# Patient Record
Sex: Female | Born: 1945
Health system: Southern US, Community
[De-identification: ages and names within clinical notes are randomized; demographics above are authoritative.]

## PROBLEM LIST (undated history)

## (undated) DIAGNOSIS — F329 Major depressive disorder, single episode, unspecified: Secondary | ICD-10-CM

## (undated) DIAGNOSIS — F419 Anxiety disorder, unspecified: Secondary | ICD-10-CM

## (undated) DIAGNOSIS — M419 Scoliosis, unspecified: Secondary | ICD-10-CM

## (undated) DIAGNOSIS — F32A Depression, unspecified: Secondary | ICD-10-CM

## (undated) DIAGNOSIS — M1990 Unspecified osteoarthritis, unspecified site: Secondary | ICD-10-CM

## (undated) DIAGNOSIS — H269 Unspecified cataract: Secondary | ICD-10-CM

## (undated) DIAGNOSIS — T7840XA Allergy, unspecified, initial encounter: Secondary | ICD-10-CM

## (undated) HISTORY — DX: Major depressive disorder, single episode, unspecified: F32.9

## (undated) HISTORY — PX: CHOLECYSTECTOMY: SHX55

## (undated) HISTORY — DX: Unspecified cataract: H26.9

## (undated) HISTORY — DX: Unspecified osteoarthritis, unspecified site: M19.90

## (undated) HISTORY — DX: Depression, unspecified: F32.A

## (undated) HISTORY — DX: Scoliosis, unspecified: M41.9

## (undated) HISTORY — DX: Allergy, unspecified, initial encounter: T78.40XA

## (undated) HISTORY — DX: Anxiety disorder, unspecified: F41.9

---

## 1999-08-24 ENCOUNTER — Emergency Department (HOSPITAL_COMMUNITY): Admission: EM | Admit: 1999-08-24 | Discharge: 1999-08-25 | Payer: Self-pay

## 2014-01-24 DIAGNOSIS — M545 Low back pain: Secondary | ICD-10-CM | POA: Diagnosis not present

## 2014-01-24 DIAGNOSIS — M4126 Other idiopathic scoliosis, lumbar region: Secondary | ICD-10-CM | POA: Diagnosis not present

## 2014-01-24 DIAGNOSIS — M5126 Other intervertebral disc displacement, lumbar region: Secondary | ICD-10-CM | POA: Diagnosis not present

## 2014-02-10 DIAGNOSIS — B0229 Other postherpetic nervous system involvement: Secondary | ICD-10-CM | POA: Diagnosis not present

## 2014-02-10 DIAGNOSIS — L0390 Cellulitis, unspecified: Secondary | ICD-10-CM | POA: Diagnosis not present

## 2014-02-10 DIAGNOSIS — L309 Dermatitis, unspecified: Secondary | ICD-10-CM | POA: Diagnosis not present

## 2014-02-10 DIAGNOSIS — Z23 Encounter for immunization: Secondary | ICD-10-CM | POA: Diagnosis not present

## 2014-02-21 DIAGNOSIS — M5417 Radiculopathy, lumbosacral region: Secondary | ICD-10-CM | POA: Diagnosis not present

## 2014-02-21 DIAGNOSIS — M4126 Other idiopathic scoliosis, lumbar region: Secondary | ICD-10-CM | POA: Diagnosis not present

## 2014-02-21 DIAGNOSIS — M5416 Radiculopathy, lumbar region: Secondary | ICD-10-CM | POA: Diagnosis not present

## 2014-02-21 DIAGNOSIS — M5126 Other intervertebral disc displacement, lumbar region: Secondary | ICD-10-CM | POA: Diagnosis not present

## 2014-02-21 DIAGNOSIS — B029 Zoster without complications: Secondary | ICD-10-CM | POA: Diagnosis not present

## 2014-02-21 DIAGNOSIS — M791 Myalgia: Secondary | ICD-10-CM | POA: Diagnosis not present

## 2014-02-21 DIAGNOSIS — M545 Low back pain: Secondary | ICD-10-CM | POA: Diagnosis not present

## 2014-02-21 DIAGNOSIS — M47817 Spondylosis without myelopathy or radiculopathy, lumbosacral region: Secondary | ICD-10-CM | POA: Diagnosis not present

## 2014-02-27 DIAGNOSIS — M79632 Pain in left forearm: Secondary | ICD-10-CM | POA: Diagnosis not present

## 2014-02-27 DIAGNOSIS — S0083XA Contusion of other part of head, initial encounter: Secondary | ICD-10-CM | POA: Diagnosis not present

## 2014-02-27 DIAGNOSIS — M5030 Other cervical disc degeneration, unspecified cervical region: Secondary | ICD-10-CM | POA: Diagnosis not present

## 2014-02-27 DIAGNOSIS — S60222A Contusion of left hand, initial encounter: Secondary | ICD-10-CM | POA: Diagnosis not present

## 2014-02-27 DIAGNOSIS — S233XXA Sprain of ligaments of thoracic spine, initial encounter: Secondary | ICD-10-CM | POA: Diagnosis not present

## 2014-02-27 DIAGNOSIS — F329 Major depressive disorder, single episode, unspecified: Secondary | ICD-10-CM | POA: Diagnosis not present

## 2014-02-27 DIAGNOSIS — G2581 Restless legs syndrome: Secondary | ICD-10-CM | POA: Diagnosis not present

## 2014-02-27 DIAGNOSIS — F17210 Nicotine dependence, cigarettes, uncomplicated: Secondary | ICD-10-CM | POA: Diagnosis not present

## 2014-02-27 DIAGNOSIS — M79641 Pain in right hand: Secondary | ICD-10-CM | POA: Diagnosis not present

## 2014-02-27 DIAGNOSIS — Z882 Allergy status to sulfonamides status: Secondary | ICD-10-CM | POA: Diagnosis not present

## 2014-02-27 DIAGNOSIS — S40022A Contusion of left upper arm, initial encounter: Secondary | ICD-10-CM | POA: Diagnosis not present

## 2014-02-27 DIAGNOSIS — M4834 Traumatic spondylopathy, thoracic region: Secondary | ICD-10-CM | POA: Diagnosis not present

## 2014-02-27 DIAGNOSIS — M25512 Pain in left shoulder: Secondary | ICD-10-CM | POA: Diagnosis not present

## 2014-02-27 DIAGNOSIS — M4120 Other idiopathic scoliosis, site unspecified: Secondary | ICD-10-CM | POA: Diagnosis not present

## 2014-02-27 DIAGNOSIS — S0090XA Unspecified superficial injury of unspecified part of head, initial encounter: Secondary | ICD-10-CM | POA: Diagnosis not present

## 2014-02-27 DIAGNOSIS — Z79899 Other long term (current) drug therapy: Secondary | ICD-10-CM | POA: Diagnosis not present

## 2014-02-27 DIAGNOSIS — Z880 Allergy status to penicillin: Secondary | ICD-10-CM | POA: Diagnosis not present

## 2014-02-27 DIAGNOSIS — I679 Cerebrovascular disease, unspecified: Secondary | ICD-10-CM | POA: Diagnosis not present

## 2014-02-27 DIAGNOSIS — S1090XA Unspecified superficial injury of unspecified part of neck, initial encounter: Secondary | ICD-10-CM | POA: Diagnosis not present

## 2014-03-21 DIAGNOSIS — M5416 Radiculopathy, lumbar region: Secondary | ICD-10-CM | POA: Diagnosis not present

## 2014-03-21 DIAGNOSIS — M5126 Other intervertebral disc displacement, lumbar region: Secondary | ICD-10-CM | POA: Diagnosis not present

## 2014-03-21 DIAGNOSIS — M5417 Radiculopathy, lumbosacral region: Secondary | ICD-10-CM | POA: Diagnosis not present

## 2014-03-21 DIAGNOSIS — Z79899 Other long term (current) drug therapy: Secondary | ICD-10-CM | POA: Diagnosis not present

## 2014-03-21 DIAGNOSIS — M791 Myalgia: Secondary | ICD-10-CM | POA: Diagnosis not present

## 2014-03-21 DIAGNOSIS — B029 Zoster without complications: Secondary | ICD-10-CM | POA: Diagnosis not present

## 2014-03-21 DIAGNOSIS — M545 Low back pain: Secondary | ICD-10-CM | POA: Diagnosis not present

## 2014-03-21 DIAGNOSIS — M47817 Spondylosis without myelopathy or radiculopathy, lumbosacral region: Secondary | ICD-10-CM | POA: Diagnosis not present

## 2014-03-21 DIAGNOSIS — M4126 Other idiopathic scoliosis, lumbar region: Secondary | ICD-10-CM | POA: Diagnosis not present

## 2014-04-17 DIAGNOSIS — H04129 Dry eye syndrome of unspecified lacrimal gland: Secondary | ICD-10-CM | POA: Diagnosis not present

## 2014-04-17 DIAGNOSIS — H16143 Punctate keratitis, bilateral: Secondary | ICD-10-CM | POA: Diagnosis not present

## 2014-04-18 DIAGNOSIS — B029 Zoster without complications: Secondary | ICD-10-CM | POA: Diagnosis not present

## 2014-04-18 DIAGNOSIS — M47817 Spondylosis without myelopathy or radiculopathy, lumbosacral region: Secondary | ICD-10-CM | POA: Diagnosis not present

## 2014-04-18 DIAGNOSIS — M5416 Radiculopathy, lumbar region: Secondary | ICD-10-CM | POA: Diagnosis not present

## 2014-04-18 DIAGNOSIS — M5126 Other intervertebral disc displacement, lumbar region: Secondary | ICD-10-CM | POA: Diagnosis not present

## 2014-04-18 DIAGNOSIS — M4126 Other idiopathic scoliosis, lumbar region: Secondary | ICD-10-CM | POA: Diagnosis not present

## 2014-04-18 DIAGNOSIS — M791 Myalgia: Secondary | ICD-10-CM | POA: Diagnosis not present

## 2014-04-18 DIAGNOSIS — M545 Low back pain: Secondary | ICD-10-CM | POA: Diagnosis not present

## 2014-04-18 DIAGNOSIS — M5417 Radiculopathy, lumbosacral region: Secondary | ICD-10-CM | POA: Diagnosis not present

## 2014-05-02 DIAGNOSIS — H04129 Dry eye syndrome of unspecified lacrimal gland: Secondary | ICD-10-CM | POA: Diagnosis not present

## 2014-05-02 DIAGNOSIS — H16143 Punctate keratitis, bilateral: Secondary | ICD-10-CM | POA: Diagnosis not present

## 2014-05-02 DIAGNOSIS — F17200 Nicotine dependence, unspecified, uncomplicated: Secondary | ICD-10-CM | POA: Diagnosis not present

## 2014-05-16 DIAGNOSIS — M5417 Radiculopathy, lumbosacral region: Secondary | ICD-10-CM | POA: Diagnosis not present

## 2014-05-16 DIAGNOSIS — B029 Zoster without complications: Secondary | ICD-10-CM | POA: Diagnosis not present

## 2014-05-16 DIAGNOSIS — M5126 Other intervertebral disc displacement, lumbar region: Secondary | ICD-10-CM | POA: Diagnosis not present

## 2014-05-16 DIAGNOSIS — M5416 Radiculopathy, lumbar region: Secondary | ICD-10-CM | POA: Diagnosis not present

## 2014-05-16 DIAGNOSIS — M47817 Spondylosis without myelopathy or radiculopathy, lumbosacral region: Secondary | ICD-10-CM | POA: Diagnosis not present

## 2014-05-16 DIAGNOSIS — M4126 Other idiopathic scoliosis, lumbar region: Secondary | ICD-10-CM | POA: Diagnosis not present

## 2014-05-16 DIAGNOSIS — M791 Myalgia: Secondary | ICD-10-CM | POA: Diagnosis not present

## 2014-05-16 DIAGNOSIS — M545 Low back pain: Secondary | ICD-10-CM | POA: Diagnosis not present

## 2014-06-03 DIAGNOSIS — M810 Age-related osteoporosis without current pathological fracture: Secondary | ICD-10-CM | POA: Diagnosis not present

## 2014-06-03 DIAGNOSIS — G2581 Restless legs syndrome: Secondary | ICD-10-CM | POA: Diagnosis not present

## 2014-06-03 DIAGNOSIS — R609 Edema, unspecified: Secondary | ICD-10-CM | POA: Diagnosis not present

## 2014-06-03 DIAGNOSIS — Z780 Asymptomatic menopausal state: Secondary | ICD-10-CM | POA: Diagnosis not present

## 2014-06-03 DIAGNOSIS — Z87891 Personal history of nicotine dependence: Secondary | ICD-10-CM | POA: Diagnosis not present

## 2014-06-03 DIAGNOSIS — F329 Major depressive disorder, single episode, unspecified: Secondary | ICD-10-CM | POA: Diagnosis not present

## 2014-06-03 DIAGNOSIS — Z880 Allergy status to penicillin: Secondary | ICD-10-CM | POA: Diagnosis not present

## 2014-06-03 DIAGNOSIS — Z882 Allergy status to sulfonamides status: Secondary | ICD-10-CM | POA: Diagnosis not present

## 2014-06-03 DIAGNOSIS — R600 Localized edema: Secondary | ICD-10-CM | POA: Diagnosis not present

## 2014-06-03 DIAGNOSIS — G8929 Other chronic pain: Secondary | ICD-10-CM | POA: Diagnosis not present

## 2014-06-13 DIAGNOSIS — M4126 Other idiopathic scoliosis, lumbar region: Secondary | ICD-10-CM | POA: Diagnosis not present

## 2014-06-13 DIAGNOSIS — M5126 Other intervertebral disc displacement, lumbar region: Secondary | ICD-10-CM | POA: Diagnosis not present

## 2014-06-13 DIAGNOSIS — M791 Myalgia: Secondary | ICD-10-CM | POA: Diagnosis not present

## 2014-06-13 DIAGNOSIS — M545 Low back pain: Secondary | ICD-10-CM | POA: Diagnosis not present

## 2014-06-13 DIAGNOSIS — M5417 Radiculopathy, lumbosacral region: Secondary | ICD-10-CM | POA: Diagnosis not present

## 2014-06-13 DIAGNOSIS — M47817 Spondylosis without myelopathy or radiculopathy, lumbosacral region: Secondary | ICD-10-CM | POA: Diagnosis not present

## 2014-06-13 DIAGNOSIS — M5416 Radiculopathy, lumbar region: Secondary | ICD-10-CM | POA: Diagnosis not present

## 2014-06-13 DIAGNOSIS — B029 Zoster without complications: Secondary | ICD-10-CM | POA: Diagnosis not present

## 2014-06-26 DIAGNOSIS — M25531 Pain in right wrist: Secondary | ICD-10-CM | POA: Diagnosis not present

## 2014-06-26 DIAGNOSIS — M419 Scoliosis, unspecified: Secondary | ICD-10-CM | POA: Diagnosis not present

## 2014-06-26 DIAGNOSIS — R600 Localized edema: Secondary | ICD-10-CM | POA: Diagnosis not present

## 2014-06-26 DIAGNOSIS — M25539 Pain in unspecified wrist: Secondary | ICD-10-CM | POA: Diagnosis not present

## 2014-06-26 DIAGNOSIS — M5417 Radiculopathy, lumbosacral region: Secondary | ICD-10-CM | POA: Diagnosis not present

## 2014-06-26 DIAGNOSIS — F329 Major depressive disorder, single episode, unspecified: Secondary | ICD-10-CM | POA: Diagnosis not present

## 2014-06-26 DIAGNOSIS — F411 Generalized anxiety disorder: Secondary | ICD-10-CM | POA: Diagnosis not present

## 2014-07-12 DIAGNOSIS — M47817 Spondylosis without myelopathy or radiculopathy, lumbosacral region: Secondary | ICD-10-CM | POA: Diagnosis not present

## 2014-07-12 DIAGNOSIS — M4126 Other idiopathic scoliosis, lumbar region: Secondary | ICD-10-CM | POA: Diagnosis not present

## 2014-07-12 DIAGNOSIS — M791 Myalgia: Secondary | ICD-10-CM | POA: Diagnosis not present

## 2014-07-12 DIAGNOSIS — M5417 Radiculopathy, lumbosacral region: Secondary | ICD-10-CM | POA: Diagnosis not present

## 2014-07-12 DIAGNOSIS — M545 Low back pain: Secondary | ICD-10-CM | POA: Diagnosis not present

## 2014-07-12 DIAGNOSIS — M5126 Other intervertebral disc displacement, lumbar region: Secondary | ICD-10-CM | POA: Diagnosis not present

## 2014-07-12 DIAGNOSIS — B029 Zoster without complications: Secondary | ICD-10-CM | POA: Diagnosis not present

## 2014-07-12 DIAGNOSIS — M5416 Radiculopathy, lumbar region: Secondary | ICD-10-CM | POA: Diagnosis not present

## 2014-08-09 DIAGNOSIS — G8929 Other chronic pain: Secondary | ICD-10-CM | POA: Diagnosis not present

## 2014-08-09 DIAGNOSIS — M549 Dorsalgia, unspecified: Secondary | ICD-10-CM | POA: Diagnosis not present

## 2014-08-10 DIAGNOSIS — M549 Dorsalgia, unspecified: Secondary | ICD-10-CM | POA: Diagnosis not present

## 2014-08-10 DIAGNOSIS — R1111 Vomiting without nausea: Secondary | ICD-10-CM | POA: Diagnosis not present

## 2014-08-10 DIAGNOSIS — R609 Edema, unspecified: Secondary | ICD-10-CM | POA: Diagnosis not present

## 2014-08-10 DIAGNOSIS — M419 Scoliosis, unspecified: Secondary | ICD-10-CM | POA: Diagnosis not present

## 2014-08-10 DIAGNOSIS — F329 Major depressive disorder, single episode, unspecified: Secondary | ICD-10-CM | POA: Diagnosis not present

## 2014-08-10 DIAGNOSIS — M545 Low back pain: Secondary | ICD-10-CM | POA: Diagnosis not present

## 2014-08-10 DIAGNOSIS — G8929 Other chronic pain: Secondary | ICD-10-CM | POA: Diagnosis not present

## 2014-08-22 DIAGNOSIS — M545 Low back pain: Secondary | ICD-10-CM | POA: Diagnosis not present

## 2014-08-22 DIAGNOSIS — M419 Scoliosis, unspecified: Secondary | ICD-10-CM | POA: Diagnosis not present

## 2014-08-22 DIAGNOSIS — R1111 Vomiting without nausea: Secondary | ICD-10-CM | POA: Diagnosis not present

## 2014-08-22 DIAGNOSIS — M549 Dorsalgia, unspecified: Secondary | ICD-10-CM | POA: Diagnosis not present

## 2014-08-22 DIAGNOSIS — G8929 Other chronic pain: Secondary | ICD-10-CM | POA: Diagnosis not present

## 2014-08-26 DIAGNOSIS — M545 Low back pain: Secondary | ICD-10-CM | POA: Diagnosis not present

## 2014-08-26 DIAGNOSIS — M25559 Pain in unspecified hip: Secondary | ICD-10-CM | POA: Diagnosis not present

## 2014-08-26 DIAGNOSIS — Z87891 Personal history of nicotine dependence: Secondary | ICD-10-CM | POA: Diagnosis not present

## 2014-08-26 DIAGNOSIS — G8929 Other chronic pain: Secondary | ICD-10-CM | POA: Diagnosis not present

## 2014-09-05 DIAGNOSIS — Z79891 Long term (current) use of opiate analgesic: Secondary | ICD-10-CM | POA: Diagnosis not present

## 2014-09-05 DIAGNOSIS — M791 Myalgia: Secondary | ICD-10-CM | POA: Diagnosis not present

## 2014-09-05 DIAGNOSIS — G8929 Other chronic pain: Secondary | ICD-10-CM | POA: Diagnosis not present

## 2014-09-05 DIAGNOSIS — M4726 Other spondylosis with radiculopathy, lumbar region: Secondary | ICD-10-CM | POA: Diagnosis not present

## 2014-09-14 DIAGNOSIS — M47816 Spondylosis without myelopathy or radiculopathy, lumbar region: Secondary | ICD-10-CM | POA: Diagnosis not present

## 2014-09-14 DIAGNOSIS — M5136 Other intervertebral disc degeneration, lumbar region: Secondary | ICD-10-CM | POA: Diagnosis not present

## 2014-09-14 DIAGNOSIS — G8929 Other chronic pain: Secondary | ICD-10-CM | POA: Diagnosis not present

## 2014-09-14 DIAGNOSIS — R296 Repeated falls: Secondary | ICD-10-CM | POA: Diagnosis not present

## 2014-09-14 DIAGNOSIS — R110 Nausea: Secondary | ICD-10-CM | POA: Diagnosis not present

## 2014-09-14 DIAGNOSIS — M545 Low back pain: Secondary | ICD-10-CM | POA: Diagnosis not present

## 2014-09-14 DIAGNOSIS — Z87891 Personal history of nicotine dependence: Secondary | ICD-10-CM | POA: Diagnosis not present

## 2014-09-14 DIAGNOSIS — M4316 Spondylolisthesis, lumbar region: Secondary | ICD-10-CM | POA: Diagnosis not present

## 2014-09-22 DIAGNOSIS — Z79891 Long term (current) use of opiate analgesic: Secondary | ICD-10-CM | POA: Diagnosis not present

## 2014-09-22 DIAGNOSIS — M4724 Other spondylosis with radiculopathy, thoracic region: Secondary | ICD-10-CM | POA: Diagnosis not present

## 2014-09-22 DIAGNOSIS — G8929 Other chronic pain: Secondary | ICD-10-CM | POA: Diagnosis not present

## 2014-09-22 DIAGNOSIS — M791 Myalgia: Secondary | ICD-10-CM | POA: Diagnosis not present

## 2014-10-03 DIAGNOSIS — M5136 Other intervertebral disc degeneration, lumbar region: Secondary | ICD-10-CM | POA: Diagnosis not present

## 2014-10-03 DIAGNOSIS — M4726 Other spondylosis with radiculopathy, lumbar region: Secondary | ICD-10-CM | POA: Diagnosis not present

## 2014-10-03 DIAGNOSIS — Z79891 Long term (current) use of opiate analgesic: Secondary | ICD-10-CM | POA: Diagnosis not present

## 2014-10-03 DIAGNOSIS — G8929 Other chronic pain: Secondary | ICD-10-CM | POA: Diagnosis not present

## 2014-10-18 DIAGNOSIS — G8929 Other chronic pain: Secondary | ICD-10-CM | POA: Diagnosis not present

## 2014-10-18 DIAGNOSIS — M4726 Other spondylosis with radiculopathy, lumbar region: Secondary | ICD-10-CM | POA: Diagnosis not present

## 2014-10-18 DIAGNOSIS — M47814 Spondylosis without myelopathy or radiculopathy, thoracic region: Secondary | ICD-10-CM | POA: Diagnosis not present

## 2014-10-18 DIAGNOSIS — Z79891 Long term (current) use of opiate analgesic: Secondary | ICD-10-CM | POA: Diagnosis not present

## 2014-11-29 DIAGNOSIS — M545 Low back pain: Secondary | ICD-10-CM | POA: Diagnosis not present

## 2014-11-29 DIAGNOSIS — M4806 Spinal stenosis, lumbar region: Secondary | ICD-10-CM | POA: Diagnosis not present

## 2014-11-29 DIAGNOSIS — G8929 Other chronic pain: Secondary | ICD-10-CM | POA: Diagnosis not present

## 2014-11-29 DIAGNOSIS — M41115 Juvenile idiopathic scoliosis, thoracolumbar region: Secondary | ICD-10-CM | POA: Diagnosis not present

## 2014-12-20 DIAGNOSIS — R110 Nausea: Secondary | ICD-10-CM | POA: Diagnosis not present

## 2014-12-20 DIAGNOSIS — G8929 Other chronic pain: Secondary | ICD-10-CM | POA: Diagnosis not present

## 2014-12-20 DIAGNOSIS — J0100 Acute maxillary sinusitis, unspecified: Secondary | ICD-10-CM | POA: Diagnosis not present

## 2014-12-20 DIAGNOSIS — R51 Headache: Secondary | ICD-10-CM | POA: Diagnosis not present

## 2014-12-20 DIAGNOSIS — M545 Low back pain: Secondary | ICD-10-CM | POA: Diagnosis not present

## 2015-01-19 DIAGNOSIS — M47817 Spondylosis without myelopathy or radiculopathy, lumbosacral region: Secondary | ICD-10-CM | POA: Diagnosis not present

## 2015-01-19 DIAGNOSIS — M4806 Spinal stenosis, lumbar region: Secondary | ICD-10-CM | POA: Diagnosis not present

## 2015-01-19 DIAGNOSIS — Z79891 Long term (current) use of opiate analgesic: Secondary | ICD-10-CM | POA: Diagnosis not present

## 2015-01-19 DIAGNOSIS — M4125 Other idiopathic scoliosis, thoracolumbar region: Secondary | ICD-10-CM | POA: Diagnosis not present

## 2015-01-19 DIAGNOSIS — M546 Pain in thoracic spine: Secondary | ICD-10-CM | POA: Diagnosis not present

## 2015-02-02 DIAGNOSIS — M47817 Spondylosis without myelopathy or radiculopathy, lumbosacral region: Secondary | ICD-10-CM | POA: Diagnosis not present

## 2015-02-02 DIAGNOSIS — Z79891 Long term (current) use of opiate analgesic: Secondary | ICD-10-CM | POA: Diagnosis not present

## 2015-02-02 DIAGNOSIS — M546 Pain in thoracic spine: Secondary | ICD-10-CM | POA: Diagnosis not present

## 2015-02-02 DIAGNOSIS — M4125 Other idiopathic scoliosis, thoracolumbar region: Secondary | ICD-10-CM | POA: Diagnosis not present

## 2015-02-02 DIAGNOSIS — M4806 Spinal stenosis, lumbar region: Secondary | ICD-10-CM | POA: Diagnosis not present

## 2015-03-07 DIAGNOSIS — M546 Pain in thoracic spine: Secondary | ICD-10-CM | POA: Diagnosis not present

## 2015-03-07 DIAGNOSIS — Z79891 Long term (current) use of opiate analgesic: Secondary | ICD-10-CM | POA: Diagnosis not present

## 2015-03-07 DIAGNOSIS — M47817 Spondylosis without myelopathy or radiculopathy, lumbosacral region: Secondary | ICD-10-CM | POA: Diagnosis not present

## 2015-03-07 DIAGNOSIS — M4806 Spinal stenosis, lumbar region: Secondary | ICD-10-CM | POA: Diagnosis not present

## 2015-03-07 DIAGNOSIS — M4125 Other idiopathic scoliosis, thoracolumbar region: Secondary | ICD-10-CM | POA: Diagnosis not present

## 2015-03-15 DIAGNOSIS — L309 Dermatitis, unspecified: Secondary | ICD-10-CM | POA: Diagnosis not present

## 2015-03-15 DIAGNOSIS — L03211 Cellulitis of face: Secondary | ICD-10-CM | POA: Diagnosis not present

## 2015-03-29 DIAGNOSIS — F329 Major depressive disorder, single episode, unspecified: Secondary | ICD-10-CM | POA: Diagnosis not present

## 2015-04-03 DIAGNOSIS — Z79891 Long term (current) use of opiate analgesic: Secondary | ICD-10-CM | POA: Diagnosis not present

## 2015-04-03 DIAGNOSIS — M546 Pain in thoracic spine: Secondary | ICD-10-CM | POA: Diagnosis not present

## 2015-04-03 DIAGNOSIS — M47817 Spondylosis without myelopathy or radiculopathy, lumbosacral region: Secondary | ICD-10-CM | POA: Diagnosis not present

## 2015-04-03 DIAGNOSIS — M4806 Spinal stenosis, lumbar region: Secondary | ICD-10-CM | POA: Diagnosis not present

## 2015-04-03 DIAGNOSIS — M4125 Other idiopathic scoliosis, thoracolumbar region: Secondary | ICD-10-CM | POA: Diagnosis not present

## 2015-05-01 DIAGNOSIS — M25552 Pain in left hip: Secondary | ICD-10-CM | POA: Diagnosis not present

## 2015-05-01 DIAGNOSIS — M4806 Spinal stenosis, lumbar region: Secondary | ICD-10-CM | POA: Diagnosis not present

## 2015-05-01 DIAGNOSIS — M47817 Spondylosis without myelopathy or radiculopathy, lumbosacral region: Secondary | ICD-10-CM | POA: Diagnosis not present

## 2015-05-01 DIAGNOSIS — Z79891 Long term (current) use of opiate analgesic: Secondary | ICD-10-CM | POA: Diagnosis not present

## 2015-05-01 DIAGNOSIS — M4125 Other idiopathic scoliosis, thoracolumbar region: Secondary | ICD-10-CM | POA: Diagnosis not present

## 2015-05-02 DIAGNOSIS — M5136 Other intervertebral disc degeneration, lumbar region: Secondary | ICD-10-CM | POA: Diagnosis not present

## 2015-05-02 DIAGNOSIS — M25552 Pain in left hip: Secondary | ICD-10-CM | POA: Diagnosis not present

## 2015-05-02 DIAGNOSIS — M4125 Other idiopathic scoliosis, thoracolumbar region: Secondary | ICD-10-CM | POA: Diagnosis not present

## 2015-05-02 DIAGNOSIS — M25551 Pain in right hip: Secondary | ICD-10-CM | POA: Diagnosis not present

## 2015-05-02 DIAGNOSIS — M419 Scoliosis, unspecified: Secondary | ICD-10-CM | POA: Diagnosis not present

## 2015-05-02 DIAGNOSIS — M47817 Spondylosis without myelopathy or radiculopathy, lumbosacral region: Secondary | ICD-10-CM | POA: Diagnosis not present

## 2015-05-02 DIAGNOSIS — M4806 Spinal stenosis, lumbar region: Secondary | ICD-10-CM | POA: Diagnosis not present

## 2015-05-02 DIAGNOSIS — M4126 Other idiopathic scoliosis, lumbar region: Secondary | ICD-10-CM | POA: Diagnosis not present

## 2015-05-22 DIAGNOSIS — M545 Low back pain: Secondary | ICD-10-CM | POA: Diagnosis not present

## 2015-05-22 DIAGNOSIS — M4806 Spinal stenosis, lumbar region: Secondary | ICD-10-CM | POA: Diagnosis not present

## 2015-05-22 DIAGNOSIS — F411 Generalized anxiety disorder: Secondary | ICD-10-CM | POA: Diagnosis not present

## 2015-05-22 DIAGNOSIS — F329 Major depressive disorder, single episode, unspecified: Secondary | ICD-10-CM | POA: Diagnosis not present

## 2015-05-22 DIAGNOSIS — G8929 Other chronic pain: Secondary | ICD-10-CM | POA: Diagnosis not present

## 2015-05-28 DIAGNOSIS — M546 Pain in thoracic spine: Secondary | ICD-10-CM | POA: Diagnosis not present

## 2015-05-28 DIAGNOSIS — M4806 Spinal stenosis, lumbar region: Secondary | ICD-10-CM | POA: Diagnosis not present

## 2015-05-28 DIAGNOSIS — M4125 Other idiopathic scoliosis, thoracolumbar region: Secondary | ICD-10-CM | POA: Diagnosis not present

## 2015-05-28 DIAGNOSIS — Z79891 Long term (current) use of opiate analgesic: Secondary | ICD-10-CM | POA: Diagnosis not present

## 2015-05-28 DIAGNOSIS — M25552 Pain in left hip: Secondary | ICD-10-CM | POA: Diagnosis not present

## 2015-05-28 DIAGNOSIS — M47817 Spondylosis without myelopathy or radiculopathy, lumbosacral region: Secondary | ICD-10-CM | POA: Diagnosis not present

## 2015-06-28 DIAGNOSIS — M549 Dorsalgia, unspecified: Secondary | ICD-10-CM | POA: Diagnosis not present

## 2015-06-28 DIAGNOSIS — F418 Other specified anxiety disorders: Secondary | ICD-10-CM | POA: Diagnosis not present

## 2015-06-28 DIAGNOSIS — M419 Scoliosis, unspecified: Secondary | ICD-10-CM | POA: Diagnosis not present

## 2015-07-25 DIAGNOSIS — M549 Dorsalgia, unspecified: Secondary | ICD-10-CM | POA: Diagnosis not present

## 2015-07-25 DIAGNOSIS — F418 Other specified anxiety disorders: Secondary | ICD-10-CM | POA: Diagnosis not present

## 2015-08-16 DIAGNOSIS — M4125 Other idiopathic scoliosis, thoracolumbar region: Secondary | ICD-10-CM | POA: Diagnosis not present

## 2015-08-16 DIAGNOSIS — M47816 Spondylosis without myelopathy or radiculopathy, lumbar region: Secondary | ICD-10-CM | POA: Diagnosis not present

## 2015-08-16 DIAGNOSIS — Z79899 Other long term (current) drug therapy: Secondary | ICD-10-CM | POA: Diagnosis not present

## 2015-08-16 DIAGNOSIS — M5416 Radiculopathy, lumbar region: Secondary | ICD-10-CM | POA: Diagnosis not present

## 2015-08-16 DIAGNOSIS — Z6821 Body mass index (BMI) 21.0-21.9, adult: Secondary | ICD-10-CM | POA: Diagnosis not present

## 2015-08-28 ENCOUNTER — Other Ambulatory Visit: Payer: Self-pay | Admitting: Orthopaedic Surgery

## 2015-08-28 DIAGNOSIS — M5416 Radiculopathy, lumbar region: Secondary | ICD-10-CM

## 2015-09-06 ENCOUNTER — Encounter: Payer: Medicare Other | Admitting: Physical Medicine & Rehabilitation

## 2015-09-14 ENCOUNTER — Encounter: Payer: Self-pay | Admitting: Physical Medicine & Rehabilitation

## 2015-09-14 ENCOUNTER — Encounter: Payer: Medicare Other | Attending: Physical Medicine & Rehabilitation | Admitting: Physical Medicine & Rehabilitation

## 2015-09-14 VITALS — BP 97/64 | HR 92

## 2015-09-14 DIAGNOSIS — IMO0001 Reserved for inherently not codable concepts without codable children: Secondary | ICD-10-CM | POA: Insufficient documentation

## 2015-09-14 DIAGNOSIS — F419 Anxiety disorder, unspecified: Secondary | ICD-10-CM | POA: Insufficient documentation

## 2015-09-14 DIAGNOSIS — M5441 Lumbago with sciatica, right side: Secondary | ICD-10-CM | POA: Diagnosis not present

## 2015-09-14 DIAGNOSIS — Z9049 Acquired absence of other specified parts of digestive tract: Secondary | ICD-10-CM | POA: Insufficient documentation

## 2015-09-14 DIAGNOSIS — M1990 Unspecified osteoarthritis, unspecified site: Secondary | ICD-10-CM | POA: Insufficient documentation

## 2015-09-14 DIAGNOSIS — K5903 Drug induced constipation: Secondary | ICD-10-CM

## 2015-09-14 DIAGNOSIS — R200 Anesthesia of skin: Secondary | ICD-10-CM | POA: Insufficient documentation

## 2015-09-14 DIAGNOSIS — G8929 Other chronic pain: Secondary | ICD-10-CM

## 2015-09-14 DIAGNOSIS — K5900 Constipation, unspecified: Secondary | ICD-10-CM | POA: Insufficient documentation

## 2015-09-14 DIAGNOSIS — Z79899 Other long term (current) drug therapy: Secondary | ICD-10-CM | POA: Diagnosis not present

## 2015-09-14 DIAGNOSIS — F329 Major depressive disorder, single episode, unspecified: Secondary | ICD-10-CM | POA: Diagnosis not present

## 2015-09-14 DIAGNOSIS — G479 Sleep disorder, unspecified: Secondary | ICD-10-CM | POA: Diagnosis not present

## 2015-09-14 DIAGNOSIS — M791 Myalgia: Secondary | ICD-10-CM | POA: Diagnosis not present

## 2015-09-14 DIAGNOSIS — K5901 Slow transit constipation: Secondary | ICD-10-CM

## 2015-09-14 DIAGNOSIS — M545 Low back pain: Secondary | ICD-10-CM | POA: Insufficient documentation

## 2015-09-14 DIAGNOSIS — M4120 Other idiopathic scoliosis, site unspecified: Secondary | ICD-10-CM

## 2015-09-14 DIAGNOSIS — M419 Scoliosis, unspecified: Secondary | ICD-10-CM | POA: Diagnosis not present

## 2015-09-14 DIAGNOSIS — Z5181 Encounter for therapeutic drug level monitoring: Secondary | ICD-10-CM

## 2015-09-14 DIAGNOSIS — F17210 Nicotine dependence, cigarettes, uncomplicated: Secondary | ICD-10-CM | POA: Diagnosis not present

## 2015-09-14 DIAGNOSIS — F32A Depression, unspecified: Secondary | ICD-10-CM

## 2015-09-14 DIAGNOSIS — T402X5A Adverse effect of other opioids, initial encounter: Secondary | ICD-10-CM | POA: Diagnosis not present

## 2015-09-14 DIAGNOSIS — M609 Myositis, unspecified: Secondary | ICD-10-CM

## 2015-09-14 MED ORDER — TRAMADOL HCL 50 MG PO TABS: 50.0000 mg | ORAL_TABLET | Freq: Two times a day (BID) | ORAL | 0 refills | Status: DC | PRN

## 2015-09-14 MED ORDER — DULOXETINE HCL 60 MG PO CPEP: 60.0000 mg | ORAL_CAPSULE | Freq: Every day | ORAL | 1 refills | Status: DC

## 2015-09-14 MED ORDER — LIDOCAINE 5 % EX PTCH: 1.0000 | MEDICATED_PATCH | CUTANEOUS | 1 refills | Status: DC

## 2015-09-14 MED ORDER — POLYETHYLENE GLYCOL 3350 17 G PO PACK: 17.0000 g | PACK | Freq: Every day | ORAL | 1 refills | Status: DC

## 2015-09-14 MED ORDER — GABAPENTIN 600 MG PO TABS: 600.0000 mg | ORAL_TABLET | Freq: Two times a day (BID) | ORAL | 1 refills | Status: DC

## 2015-09-14 MED ORDER — SENNOSIDES-DOCUSATE SODIUM 8.6-50 MG PO TABS: 2.0000 | ORAL_TABLET | Freq: Two times a day (BID) | ORAL | 1 refills | Status: DC

## 2015-09-14 MED ORDER — BACLOFEN 10 MG PO TABS: 5.0000 mg | ORAL_TABLET | Freq: Three times a day (TID) | ORAL | 0 refills | Status: DC

## 2015-09-14 NOTE — Progress Notes (Addendum)
Subjective:    Patient ID: Stephanie Frank, female    DOB: December 15, 1945, 70 y.o.   MRN: [id number]  HPI  70 y/o female with pmh of anxiety, OA, depression, scoliosis, lumbar herniated disc presents for evaluation of low back pain. Mainly on right side.  Pain started~2010.  Denies inciting event.  Laying down improves the pain.  Activity exacerbates the pain.  Describes as achy and burning. Radiates at times to her anterior chest. Constant.  Associated muscle spasms, numbness, tingling, weakness in right leg.  She has recently moved to the area and her husband passed 04/2014.  Previously she was on dilaudid and then switched to Oxycodone, but that makes her "violently ill".  She has tried Ambulance person as well.  She had trigger point injections with some benefit.  She 1 fall in the last year due to slipping on soap.  Pain limits pt from doing "everything".  Of note, pt's previous physician was weaning narcotics and benzos.  Pt also recently saw surgeon who ordered imaging and followed up planned for next week.   Pain Inventory Average Pain 8 Pain Right Now 5 My pain is intermittent, burning, tingling and aching  In the last 24 hours, has pain interfered with the following? General activity 8 Relation with others 0 Enjoyment of life 7 What TIME of day is your pain at its worst? morning and night Sleep (in general) Poor  Pain is worse with: walking, bending, sitting, standing and some activites Pain improves with: rest, heat/ice, TENS and injections Relief from Meds: 4  Mobility walk without assistance how many minutes can you walk? 5 ability to climb steps?  yes do you drive?  yes transfers alone  Function retired I need assistance with the following:  household duties and shopping Do you have any goals in this area?  yes  Neuro/Psych numbness tingling spasms depression  Prior Studies na  Physicians involved in your care na   Family History  Problem Relation Age of Onset    . Adopted: Yes  . Family history unknown: Yes   Social History   Social History  . Marital status: Unknown    Spouse name: N/A  . Number of children: N/A  . Years of education: N/A   Social History Main Topics  . Smoking status: Current Some Day Smoker  . Smokeless tobacco: Never Used  . Alcohol use No  . Drug use: No  . Sexual activity: Not Asked   Other Topics Concern  . None   Social History Narrative  . None   Past Surgical History:  Procedure Laterality Date  . CHOLECYSTECTOMY     Past Medical History:  Diagnosis Date  . Allergy   . Anxiety   . Arthritis   . Cataract   . Depression    BP 97/64   Pulse 92   SpO2 92%   Opioid Risk Score:   Fall Risk Score:  `1  Depression screen PHQ 2/9  Depression screen PHQ 2/9 09/14/2015  Decreased Interest 1  Down, Depressed, Hopeless 1  PHQ - 2 Score 2  Altered sleeping 3  Tired, decreased energy 3  Change in appetite 1  Feeling bad or failure about yourself  0  Trouble concentrating 0  Moving slowly or fidgety/restless 0  Suicidal thoughts 0  PHQ-9 Score 9    Review of Systems  Constitutional: Negative.   HENT: Negative.   Eyes: Negative.   Respiratory: Negative.   Cardiovascular: Negative.   Gastrointestinal: Positive for  constipation and nausea.  Endocrine: Negative.   Genitourinary: Negative.   Musculoskeletal: Positive for back pain.  Skin: Negative.   Neurological: Positive for numbness.  Hematological: Negative.   Psychiatric/Behavioral: Positive for dysphoric mood. The patient is nervous/anxious.   All other systems reviewed and are negative.     Objective:   Physical Exam Gen: NAD. Vital signs reviewed HENT: Normocephalic, Atraumatic Eyes: EOMI, Conj WNL Cardio: S1, S2 normal, RRR Pulm: B/l clear to auscultation.  Effort normal Abd: Soft, non-distended, non-tender, BS+ MSK:  Gait WNL.   TTP over lumbar spine R>L with jumping and moaning.    No edema.   ROM limited in all plains  due to pain, even with slight ROM Neuro: CN II-XII grossly intact.    Sensation intact to light touch in all LE dermatomes  Reflexes appear 2+ throughout, limited due to pain  Strength  4/5 in all LE myotomes (pain inhibition)  SLR appears neg b/l, limited due to pain Skin: Warm and Dry Psych: Very anxious   Assessment & Plan:  70 y/o female with pmh of anxiety, OA, depression, scoliosis, lumbar herniated disc presents for evaluation of low back pain.  1. Chronic mechanical low back pain with scoliosis  Skelaxin has not provided relief  Pt currently being evaluated by surgery  No imaging on record, however, she is scheduled to have imaging 9/9 at [facility] imaging, requested records be sent to office  Educated on footware  Cont heat  Cont TENS- pt has at home, which provides benefit  Pt has brace, cont bracing, educated on limiting use to periods of increased activity  Requested pt to send records from [place] of injections  Will refer to PT for core strengthening and stretching and aquatic therapy  Will refer for [employer]  Will order Lidoderm patches - pt states she had some itching with mild erythema on previous trial, encouraged to clean area before application and retrial  Will order tramadol 50 BID PRN - pt has had benefit, educated on signs symptoms of serotonin syndrome  Will order baclofen 5mg  TID PRN  Will order Cymbalta 60mg    NCCSR reviewed   2. Myalgias  Will consider trigger point injections based on results of imagning   3. Opiod induced constipation with hx of constipation  Should improve with reduction in opiods  Will order Miralax BID  Will order Senna - S 2 tab BID  4. Sleep disturbance  See #1  Pt also using also mattress, encouraged replacement  Increased Gabapentin 600 BID  5. Depression  Changed effexor to Cymbalta per pt request

## 2015-09-14 NOTE — Addendum Note (Signed)
Addended by: Delice Lesch A on: 09/14/2015 10:32 AM   Modules accepted: Orders

## 2015-09-15 ENCOUNTER — Other Ambulatory Visit: Payer: Medicare Other

## 2015-09-21 LAB — TOXASSURE SELECT,+ANTIDEPR,UR

## 2015-09-25 ENCOUNTER — Telehealth: Payer: Self-pay | Admitting: Physical Medicine & Rehabilitation

## 2015-09-25 NOTE — Telephone Encounter (Signed)
Patient would like for Dr. Posey Pronto to call her something for nausea.  She thinks it's because of new anti depresant she is on and also that she was taken off oxycodone and now taking tramadol.  Please call patient.

## 2015-09-26 NOTE — Telephone Encounter (Signed)
Please advise 

## 2015-09-26 NOTE — Telephone Encounter (Signed)
That can happen with Cymbalta, however, she was previously on an SSRI.  I would recommend she take 30mg  instead of 60 for 2 weeks and then increase to 60.  If she is still nauseated, we can prescribed Zofran 4mg  BID PRN.  Thanks.

## 2015-09-26 NOTE — Progress Notes (Signed)
Urine drug screen for this encounter is consistent for prescribed medications.   

## 2015-09-27 MED ORDER — DULOXETINE HCL 30 MG PO CPEP: 30.0000 mg | ORAL_CAPSULE | Freq: Every day | ORAL | 0 refills | Status: DC

## 2015-09-27 NOTE — Telephone Encounter (Signed)
Pt states that she will try 30mg  Cymbalta for the next two weeks. She does state that the Tramadol is not helping with her pain at all. She is not wanting to get back on the Oxycodone, but she does want something stronger than Tramadol. She also states that she wants to hold off on the Zofran for a little while.Please advise.

## 2015-09-27 NOTE — Telephone Encounter (Signed)
She may try taking 100mg  of tramadol as needed. Thanks

## 2015-09-30 ENCOUNTER — Ambulatory Visit
Admission: RE | Admit: 2015-09-30 | Discharge: 2015-09-30 | Disposition: A | Discharge status: Home / Self Care | Payer: Medicare Other | Source: Ambulatory Visit | Attending: Orthopaedic Surgery | Admitting: Orthopaedic Surgery

## 2015-09-30 DIAGNOSIS — M5416 Radiculopathy, lumbar region: Secondary | ICD-10-CM

## 2015-09-30 DIAGNOSIS — M5126 Other intervertebral disc displacement, lumbar region: Secondary | ICD-10-CM | POA: Diagnosis not present

## 2015-10-01 ENCOUNTER — Other Ambulatory Visit: Payer: Self-pay | Admitting: Physical Medicine & Rehabilitation

## 2015-10-01 NOTE — Telephone Encounter (Signed)
Patient is calling back about her nausea and Tramadol.  Please call patient.

## 2015-10-02 ENCOUNTER — Ambulatory Visit (INDEPENDENT_AMBULATORY_CARE_PROVIDER_SITE_OTHER): Payer: Medicare Other | Admitting: Physician Assistant

## 2015-10-02 ENCOUNTER — Ambulatory Visit (INDEPENDENT_AMBULATORY_CARE_PROVIDER_SITE_OTHER): Payer: Medicare Other

## 2015-10-02 VITALS — BP 127/73 | HR 94 | Temp 98.0°F | Resp 16 | Ht 64.0 in | Wt 123.0 lb

## 2015-10-02 DIAGNOSIS — R059 Cough, unspecified: Secondary | ICD-10-CM

## 2015-10-02 DIAGNOSIS — R6883 Chills (without fever): Secondary | ICD-10-CM

## 2015-10-02 DIAGNOSIS — R05 Cough: Secondary | ICD-10-CM

## 2015-10-02 DIAGNOSIS — R9389 Abnormal findings on diagnostic imaging of other specified body structures: Secondary | ICD-10-CM

## 2015-10-02 DIAGNOSIS — R938 Abnormal findings on diagnostic imaging of other specified body structures: Secondary | ICD-10-CM

## 2015-10-02 LAB — POCT CBC
Granulocyte percent: 82.2 %G — AB (ref 37–80)
HCT, POC: 33.6 % — AB (ref 37.7–47.9)
Hemoglobin: 11.4 g/dL — AB (ref 12.2–16.2)
Lymph, poc: 0.8 (ref 0.6–3.4)
MCH: 29 pg (ref 27–31.2)
MCHC: 34 g/dL (ref 31.8–35.4)
MCV: 85.3 fL (ref 80–97)
MID (CBC): 0.2 (ref 0–0.9)
MPV: 6.7 fL (ref 0–99.8)
PLATELET COUNT, POC: 370 10*3/uL (ref 142–424)
POC Granulocyte: 4.6 (ref 2–6.9)
POC LYMPH PERCENT: 13.7 %L (ref 10–50)
POC MID %: 4.1 %M (ref 0–12)
RBC: 3.93 M/uL — AB (ref 4.04–5.48)
RDW, POC: 15.9 %
WBC: 5.6 10*3/uL (ref 4.6–10.2)

## 2015-10-02 MED ORDER — DOXYCYCLINE HYCLATE 100 MG PO TABS: 100.0000 mg | ORAL_TABLET | Freq: Two times a day (BID) | ORAL | 0 refills | Status: DC

## 2015-10-02 MED ORDER — ONDANSETRON 4 MG PO TBDP: 4.0000 mg | ORAL_TABLET | Freq: Three times a day (TID) | ORAL | 0 refills | Status: DC | PRN

## 2015-10-02 MED ORDER — HYDROCODONE-HOMATROPINE 5-1.5 MG/5ML PO SYRP: 2.5000 mL | ORAL_SOLUTION | Freq: Every day | ORAL | 0 refills | Status: AC

## 2015-10-02 MED ORDER — PREDNISONE 20 MG PO TABS: 40.0000 mg | ORAL_TABLET | Freq: Every day | ORAL | 0 refills | Status: DC

## 2015-10-02 NOTE — Patient Instructions (Addendum)
  Please come back in three to four days if your are not feeling better.  I am sending you to a pulmonologist for an abnormal chest xray.  I am treating your symptoms with an antibiotic and a steroid and this should help you feel better.     IF you received an x-ray today, you will receive an invoice from [facility]. Please contact [facility] at [phone] with questions or concerns regarding your invoice.   IF you received labwork today, you will receive an invoice from [employer]. Please contact [facility] at [phone] with questions or concerns regarding your invoice.   Our billing staff will not be able to assist you with questions regarding bills from these companies.  You will be contacted with the lab results as soon as they are available. The fastest way to get your results is to activate your My Chart account. Instructions are located on the last page of this paperwork. If you have not heard from [place] regarding the results in 2 weeks, please contact this office.

## 2015-10-02 NOTE — Telephone Encounter (Signed)
called patient and left a message to call us back

## 2015-10-02 NOTE — Addendum Note (Signed)
Addended by: Tereasa Coop on: 10/02/2015 03:34 PM   Modules accepted: Orders

## 2015-10-02 NOTE — Progress Notes (Signed)
10/02/2015 3:21 PM   DOB: 03-06-1945 / MRN: [id number]  SUBJECTIVE:  Stephanie Frank is a 70 y.o. female with a 30 pack year history of smoking presenting for nasal congestion, cough, and sore throat that started four days ago.  She associates subjective fever and chills.  Feels that she is getting worse.  Denies a history of diabetes.  She is under a lot of stress due to the loss of several family members at this time.    She is allergic to codeine and oxycodone.   She  has a past medical history of Allergy; Anxiety; Arthritis; Cataract; and Depression.    She  reports that she has been smoking.  She has never used smokeless tobacco. She reports that she does not drink alcohol or use drugs. She  has no sexual activity history on file. The patient  has a past surgical history that includes Cholecystectomy.  Her She was adopted. Family history is unknown by patient.  Review of Systems  Constitutional: Positive for chills, diaphoresis, fever and malaise/fatigue.  Respiratory: Positive for cough and sputum production. Negative for hemoptysis, shortness of breath and wheezing.   Cardiovascular: Negative for chest pain.  Gastrointestinal: Negative for nausea.  Skin: Negative for rash.  Neurological: Negative for dizziness.    The problem list and medications were reviewed and updated by myself where necessary and exist elsewhere in the encounter.   OBJECTIVE:  BP 127/73 (BP Location: Right Arm, Patient Position: Sitting, Cuff Size: Normal)   Pulse 94   Temp 98 F (36.7 C) (Oral)   Resp 16   Ht 5\' 4"  (1.626 m)   Wt 123 lb (55.8 kg)   SpO2 95%   BMI 21.11 kg/m   Physical Exam  Constitutional: She is oriented to person, place, and time. She appears well-developed and well-nourished.  Seen lying on the exam table upon initiation of interview.  Cardiovascular: Normal rate and regular rhythm.   Pulmonary/Chest: Effort normal and breath sounds normal. She has no wheezes. She has no  rales.  Musculoskeletal: Normal range of motion.  Neurological: She is alert and oriented to person, place, and time.  Skin: Skin is warm and dry.  Psychiatric: She has a normal mood and affect.    Results for orders placed or performed in visit on 10/02/15 (from the past 72 hour(s))  POCT CBC     Status: Abnormal   Collection Time: 10/02/15  3:09 PM  Result Value Ref Range   WBC 5.6 4.6 - 10.2 K/uL   Lymph, poc 0.8 0.6 - 3.4   POC LYMPH PERCENT 13.7 10 - 50 %L   MID (cbc) 0.2 0 - 0.9   POC MID % 4.1 0 - 12 %M   POC Granulocyte 4.6 2 - 6.9   Granulocyte percent 82.2 (A) 37 - 80 %G   RBC 3.93 (A) 4.04 - 5.48 M/uL   Hemoglobin 11.4 (A) 12.2 - 16.2 g/dL   HCT, POC 33.6 (A) 37.7 - 47.9 %   MCV 85.3 80 - 97 fL   MCH, POC 29.0 27 - 31.2 pg   MCHC 34.0 31.8 - 35.4 g/dL   RDW, POC 15.9 %   Platelet Count, POC 370 142 - 424 K/uL   MPV 6.7 0 - 99.8 fL    Dg Chest 2 View  Result Date: 10/02/2015 CLINICAL DATA:  Cough, history of smoking, chills EXAM: CHEST  2 VIEW COMPARISON:  08/16/2015 spine radiographs FINDINGS: Hyperinflation of the upper lobes with attenuated  pulmonary vessels. There is resultant slight crowding of lower lobe interstitial lung markings. There is atelectasis and/or scarring at the right lung base medially. No pneumonic consolidation, CHF nor effusion. The heart is normal in size. The aorta is not aneurysmal. Mild prominence of the pulmonary arteries suggesting pulmonary hypertension. Dextroconvex curvature of the upper lumbar spine. IMPRESSION: 1. No active cardiopulmonary disease. 2. COPD. 3. Mild prominence of the pulmonary arteries suggest pulmonary hypertension. 4. Dextroscoliosis of the upper lumbar spine. Electronically Signed   By: Ashley Royalty M.D.   On: 10/02/2015 14:50    ASSESSMENT AND PLAN  Stephanie Frank was seen today for cough, breathing problem and back pain.  Diagnoses and all orders for this visit:  Cough: Rads calling COPD and possible pulmonary HTN.   Given this I am treating for a COPD flare and will get her in with pulmonology. RTC in three to four days if not improving.  -     DG Chest 2 View; Future -     doxycycline (VIBRA-TABS) 100 MG tablet; Take 1 tablet (100 mg total) by mouth 2 (two) times daily. -     predniSONE (DELTASONE) 20 MG tablet; Take 2 tablets (40 mg total) by mouth daily with breakfast.  Chills -     POCT CBC  Abnormal chest x-ray -     Ambulatory referral to Pulmonology    The patient is advised to call or return to clinic if she does not see an improvement in symptoms, or to seek the care of the closest emergency department if she worsens with the above plan.   Philis Fendt, MHS, PA-C [facility] 10/02/2015 3:21 PM

## 2015-10-04 NOTE — Telephone Encounter (Signed)
Multiple attempts to reach patient have been made, pt's Voicemail box is full.

## 2015-10-04 NOTE — Telephone Encounter (Signed)
Multiple attempts to reach patient have been made, pt's mailbox is full

## 2015-10-10 DIAGNOSIS — H2511 Age-related nuclear cataract, right eye: Secondary | ICD-10-CM | POA: Diagnosis not present

## 2015-10-10 DIAGNOSIS — H2702 Aphakia, left eye: Secondary | ICD-10-CM | POA: Diagnosis not present

## 2015-10-26 ENCOUNTER — Ambulatory Visit: Payer: Medicare Other | Admitting: Physical Medicine & Rehabilitation

## 2015-11-05 ENCOUNTER — Other Ambulatory Visit: Payer: Self-pay | Admitting: Physical Medicine & Rehabilitation

## 2015-11-06 ENCOUNTER — Telehealth: Payer: Self-pay | Admitting: *Deleted

## 2015-11-06 ENCOUNTER — Ambulatory Visit: Payer: Medicare Other

## 2015-11-06 NOTE — Telephone Encounter (Signed)
Stephanie Frank called and said she was sick and was requesting refill on zofran.  Her pharmacy sent a request as well.  I rejected the request and left a message for Stephanie Frank that we have not prescribed that medication for her and we are not her primary care physician.  If she is feeling ill she should contact her PCP or go to urgent care.

## 2015-11-08 ENCOUNTER — Encounter: Payer: Medicare Other | Attending: Physical Medicine & Rehabilitation | Admitting: Physical Medicine & Rehabilitation

## 2015-11-08 ENCOUNTER — Encounter: Payer: Self-pay | Admitting: Physical Medicine & Rehabilitation

## 2015-11-08 VITALS — BP 137/91 | HR 71

## 2015-11-08 DIAGNOSIS — Z5181 Encounter for therapeutic drug level monitoring: Secondary | ICD-10-CM

## 2015-11-08 DIAGNOSIS — M419 Scoliosis, unspecified: Secondary | ICD-10-CM | POA: Diagnosis not present

## 2015-11-08 DIAGNOSIS — K5903 Drug induced constipation: Secondary | ICD-10-CM

## 2015-11-08 DIAGNOSIS — F17210 Nicotine dependence, cigarettes, uncomplicated: Secondary | ICD-10-CM | POA: Insufficient documentation

## 2015-11-08 DIAGNOSIS — G8929 Other chronic pain: Secondary | ICD-10-CM | POA: Diagnosis not present

## 2015-11-08 DIAGNOSIS — R200 Anesthesia of skin: Secondary | ICD-10-CM | POA: Insufficient documentation

## 2015-11-08 DIAGNOSIS — F419 Anxiety disorder, unspecified: Secondary | ICD-10-CM | POA: Insufficient documentation

## 2015-11-08 DIAGNOSIS — K219 Gastro-esophageal reflux disease without esophagitis: Secondary | ICD-10-CM

## 2015-11-08 DIAGNOSIS — M545 Low back pain: Secondary | ICD-10-CM | POA: Insufficient documentation

## 2015-11-08 DIAGNOSIS — R1115 Cyclical vomiting syndrome unrelated to migraine: Secondary | ICD-10-CM

## 2015-11-08 DIAGNOSIS — T402X5A Adverse effect of other opioids, initial encounter: Secondary | ICD-10-CM | POA: Insufficient documentation

## 2015-11-08 DIAGNOSIS — M4125 Other idiopathic scoliosis, thoracolumbar region: Secondary | ICD-10-CM

## 2015-11-08 DIAGNOSIS — F329 Major depressive disorder, single episode, unspecified: Secondary | ICD-10-CM | POA: Insufficient documentation

## 2015-11-08 DIAGNOSIS — M1990 Unspecified osteoarthritis, unspecified site: Secondary | ICD-10-CM | POA: Diagnosis not present

## 2015-11-08 DIAGNOSIS — M5441 Lumbago with sciatica, right side: Secondary | ICD-10-CM

## 2015-11-08 DIAGNOSIS — M791 Myalgia: Secondary | ICD-10-CM | POA: Insufficient documentation

## 2015-11-08 DIAGNOSIS — G479 Sleep disorder, unspecified: Secondary | ICD-10-CM

## 2015-11-08 DIAGNOSIS — K5900 Constipation, unspecified: Secondary | ICD-10-CM | POA: Diagnosis not present

## 2015-11-08 DIAGNOSIS — K5901 Slow transit constipation: Secondary | ICD-10-CM

## 2015-11-08 DIAGNOSIS — Z9049 Acquired absence of other specified parts of digestive tract: Secondary | ICD-10-CM | POA: Insufficient documentation

## 2015-11-08 DIAGNOSIS — G43A1 Cyclical vomiting, intractable: Secondary | ICD-10-CM

## 2015-11-08 DIAGNOSIS — F32A Depression, unspecified: Secondary | ICD-10-CM

## 2015-11-08 MED ORDER — ONDANSETRON HCL 4 MG PO TABS: 4.0000 mg | ORAL_TABLET | Freq: Two times a day (BID) | ORAL | 1 refills | Status: DC | PRN

## 2015-11-08 MED ORDER — BACLOFEN 10 MG PO TABS: 10.0000 mg | ORAL_TABLET | Freq: Three times a day (TID) | ORAL | 0 refills | Status: DC

## 2015-11-08 MED ORDER — FAMOTIDINE 40 MG PO TABS: 40.0000 mg | ORAL_TABLET | Freq: Every day | ORAL | 1 refills | Status: DC

## 2015-11-08 MED ORDER — TRAMADOL HCL 50 MG PO TABS: 50.0000 mg | ORAL_TABLET | Freq: Four times a day (QID) | ORAL | 0 refills | Status: DC | PRN

## 2015-11-08 NOTE — Progress Notes (Signed)
Subjective:    Patient ID: Stephanie Frank, female    DOB: June 08, 1945, 70 y.o.   MRN: [id number]  HPI 70 y/o female with pmh of anxiety, OA, depression, scoliosis, lumbar herniated disc presents for follow up of low back pain. Mainly on right side.  Pain started~2010.  Denies inciting event.  Laying down improves the pain.  Activity exacerbates the pain.  Describes as achy and burning. Radiates at times to her anterior chest. Constant.  Associated muscle spasms, numbness, tingling, weakness in right leg.  She has recently moved to the area and her husband passed 04/2014.  Previously she was on dilaudid and then switched to Oxycodone, but that makes her "violently ill".  She has tried Ambulance person as well.  She had trigger point injections with some benefit.  She 1 fall in the last year due to slipping on soap.  Pain limits pt from doing "everything".  Of note, pt's previous physician was weaning narcotics and benzos.    Last clinic visit 09/14/15.  Since that visit, she states she has been sick.  She had a cold and had an xray that was questionable and is scheduled to see Pulm next week.  She was going to see a Psychologist, sport and exercise for her scoliosis.  But she became sick and has not been able to go because she has been ill.  She is going next week.  Heat continues to help.  TENS help, but she ran out of batteries and has not gotten new ones.  She never obtained records of her injections in [place].  Pt states she never received a call from PT.  Pt states she never tried the lidoderm patches, despite discussion last time.  Tramadol helps moderately.  Baclofen did not help.  Cymbalta did not help.  Her constipation is erratic right now, since she has been sick.  Gabapentin makes her feel like she is going to fall.  She has a list of questions related to mostly GI symptoms.   At present pt appears to have respiratory and GI symptoms confounding symptoms that have persisted for >1 month.  Encouraged follow up with Pulm +/- GI.     Pain Inventory Average Pain 8 Pain Right Now 9 My pain is constant, sharp, burning, stabbing and aching  In the last 24 hours, has pain interfered with the following? General activity 8 Relation with others 8 Enjoyment of life 8 What TIME of day is your pain at its worst? night Sleep (in general) Poor  Pain is worse with: walking, bending, sitting, standing and some activites Pain improves with: rest and heat/ice Relief from Meds: 0  Mobility walk without assistance how many minutes can you walk? 5 do you drive?  yes Do you have any goals in this area?  yes  Function retired I need assistance with the following:  meal prep, household duties and shopping  Neuro/Psych bladder control problems bowel control problems spasms dizziness anxiety  Prior Studies Any changes since last visit?  yes x-rays CT/MRI  Physicians involved in your care [facility] Specialist Dr. Gayla Medicus   Family History  Problem Relation Age of Onset  . Adopted: Yes  . Family history unknown: Yes   Social History   Social History  . Marital status: Widowed    Spouse name: N/A  . Number of children: N/A  . Years of education: N/A   Social History Main Topics  . Smoking status: Light Tobacco Smoker  . Smokeless tobacco: Never Used  .  Alcohol use No  . Drug use: No  . Sexual activity: Not Asked   Other Topics Concern  . None   Social History Narrative  . None   Past Surgical History:  Procedure Laterality Date  . CHOLECYSTECTOMY     Past Medical History:  Diagnosis Date  . Allergy   . Anxiety   . Arthritis   . Cataract   . Depression   . Scoliosis    BP (!) 137/91   Pulse 71   SpO2 95%   Opioid Risk Score:   Fall Risk Score:  `1  Depression screen PHQ 2/9  Depression screen [facility] 2/9 10/02/2015 09/14/2015  Decreased Interest 0 1  Down, Depressed, Hopeless 0 1  PHQ - 2 Score 0 2  Altered sleeping - 3  Tired, decreased energy - 3  Change in appetite - 1   Feeling bad or failure about yourself  - 0  Trouble concentrating - 0  Moving slowly or fidgety/restless - 0  Suicidal thoughts - 0  PHQ-9 Score - 9    Review of Systems  Constitutional: Positive for appetite change.  HENT: Negative.   Eyes: Negative.   Respiratory: Positive for apnea, cough and wheezing.   Cardiovascular: Negative.   Gastrointestinal: Positive for abdominal pain, constipation, diarrhea and nausea.  Endocrine: Negative.   Genitourinary: Positive for dyspareunia and dysuria.  Musculoskeletal: Positive for back pain.  Skin: Positive for color change and rash.  Neurological: Positive for dizziness.       Spasms   Hematological: Negative.   Psychiatric/Behavioral: The patient is nervous/anxious.   All other systems reviewed and are negative.     Objective:   Physical Exam Gen: NAD. Vital signs reviewed HENT: Normocephalic, Atraumatic Eyes: EOMI.  No discharge.  Cardio: RRR. No JVD. Pulm: B/l clear to auscultation.  Effort normal Abd: Soft, non-distended, non-tender, BS+ MSK:  Gait WNL.   TTP over lumbar spine R>L with jumping and moaning.    No edema.   ROM limited in all plains due to pain, even with slight ROM (improved) Neuro: CN II-XII grossly intact.    Sensation intact to light touch in all LE dermatomes  Reflexes appear 2+ throughout, limited due to pain  Strength  4/5 in all LE myotomes (pain inhibition)  SLR appears neg b/l, limited due to pain Skin: Warm and Dry Psych: Anxious (slightly improved)    Assessment & Plan:  70 y/o female with pmh of anxiety, OA, depression, scoliosis, lumbar herniated disc presents for follow up of low back pain.  1. Chronic mechanical low back pain with scoliosis  MRI 09/2015 showing moderately severe lumbar dextroscoliosis, multilevel disc degeneration, advanced in the upper lumbar spine with mild-to-moderate neural foraminal lateral recess  Skelaxin has not provided relief  Not willing to try Lidoderm patches  due to previous rash  Educated on footware  Pt does not want to take Cymbalta at present   Cont heat  Encouraged pt to cont TENS (pt to replace batteries)  Pt has brace, cont bracing, educated on limiting use to periods of increased activity  Requested pt to send records from [place] of injections (again)  Encouraged pt to follow up with PT for core strengthening and stretching and aquatic therapy (referral previously made)  Pt currently being evaluated by surgery  Will increase tramadol to 50 4/day PRN - pt has had benefit, educated on signs symptoms of serotonin syndrome  Will increase baclofen to 10mg  TID PRN   2. Myalgias  Will hold off on injections due to current illness  3. Opiod induced constipation with hx of constipation  Should improve with reduction in opiods  Cont Miralax BID  Cont Senna - S 2 tab BID  Pt is interested in seeing GI for what she thinks may be IBS  4. Sleep disturbance  See #1  Pt also using also mattress, encouraged replacement  Pt has stopped Gabapentin  5. Depression  Changed effexor to Cymbalta per pt request  Pt currently not taking medications due to confusion of symptoms  6. GERD  Pepcid ordered 40mg  daily  7. Nausea  Zofran ordered

## 2015-11-13 ENCOUNTER — Institutional Professional Consult (permissible substitution): Payer: Medicare Other | Admitting: Pulmonary Disease

## 2015-11-15 DIAGNOSIS — M4125 Other idiopathic scoliosis, thoracolumbar region: Secondary | ICD-10-CM | POA: Diagnosis not present

## 2015-11-15 DIAGNOSIS — M47816 Spondylosis without myelopathy or radiculopathy, lumbar region: Secondary | ICD-10-CM | POA: Diagnosis not present

## 2015-11-15 DIAGNOSIS — M5416 Radiculopathy, lumbar region: Secondary | ICD-10-CM | POA: Diagnosis not present

## 2015-12-07 ENCOUNTER — Institutional Professional Consult (permissible substitution): Payer: Medicare Other | Admitting: Pulmonary Disease

## 2015-12-11 ENCOUNTER — Other Ambulatory Visit: Payer: Self-pay | Admitting: Physical Medicine & Rehabilitation

## 2015-12-14 ENCOUNTER — Encounter: Payer: Medicare Other | Admitting: Physical Medicine & Rehabilitation

## 2015-12-20 ENCOUNTER — Encounter: Payer: Medicare Other | Admitting: Physical Medicine & Rehabilitation

## 2015-12-27 ENCOUNTER — Encounter: Payer: Medicare Other | Attending: Physical Medicine & Rehabilitation | Admitting: Physical Medicine & Rehabilitation

## 2015-12-27 ENCOUNTER — Encounter: Payer: Self-pay | Admitting: Physical Medicine & Rehabilitation

## 2015-12-27 VITALS — BP 117/79 | HR 94

## 2015-12-27 DIAGNOSIS — M4125 Other idiopathic scoliosis, thoracolumbar region: Secondary | ICD-10-CM

## 2015-12-27 DIAGNOSIS — M545 Low back pain: Secondary | ICD-10-CM | POA: Diagnosis not present

## 2015-12-27 DIAGNOSIS — T402X5A Adverse effect of other opioids, initial encounter: Secondary | ICD-10-CM | POA: Insufficient documentation

## 2015-12-27 DIAGNOSIS — G479 Sleep disorder, unspecified: Secondary | ICD-10-CM

## 2015-12-27 DIAGNOSIS — K5900 Constipation, unspecified: Secondary | ICD-10-CM | POA: Insufficient documentation

## 2015-12-27 DIAGNOSIS — F17210 Nicotine dependence, cigarettes, uncomplicated: Secondary | ICD-10-CM | POA: Insufficient documentation

## 2015-12-27 DIAGNOSIS — F329 Major depressive disorder, single episode, unspecified: Secondary | ICD-10-CM | POA: Diagnosis not present

## 2015-12-27 DIAGNOSIS — K5903 Drug induced constipation: Secondary | ICD-10-CM | POA: Diagnosis not present

## 2015-12-27 DIAGNOSIS — Z9049 Acquired absence of other specified parts of digestive tract: Secondary | ICD-10-CM | POA: Insufficient documentation

## 2015-12-27 DIAGNOSIS — F419 Anxiety disorder, unspecified: Secondary | ICD-10-CM | POA: Insufficient documentation

## 2015-12-27 DIAGNOSIS — G8929 Other chronic pain: Secondary | ICD-10-CM | POA: Diagnosis not present

## 2015-12-27 DIAGNOSIS — K219 Gastro-esophageal reflux disease without esophagitis: Secondary | ICD-10-CM

## 2015-12-27 DIAGNOSIS — F32A Depression, unspecified: Secondary | ICD-10-CM

## 2015-12-27 DIAGNOSIS — M1990 Unspecified osteoarthritis, unspecified site: Secondary | ICD-10-CM | POA: Insufficient documentation

## 2015-12-27 DIAGNOSIS — M791 Myalgia: Secondary | ICD-10-CM | POA: Insufficient documentation

## 2015-12-27 DIAGNOSIS — K5901 Slow transit constipation: Secondary | ICD-10-CM

## 2015-12-27 DIAGNOSIS — M419 Scoliosis, unspecified: Secondary | ICD-10-CM | POA: Insufficient documentation

## 2015-12-27 DIAGNOSIS — M5441 Lumbago with sciatica, right side: Secondary | ICD-10-CM

## 2015-12-27 DIAGNOSIS — R200 Anesthesia of skin: Secondary | ICD-10-CM | POA: Insufficient documentation

## 2015-12-27 MED ORDER — TRAMADOL HCL 50 MG PO TABS: 50.0000 mg | ORAL_TABLET | Freq: Four times a day (QID) | ORAL | 1 refills | Status: AC | PRN

## 2015-12-27 MED ORDER — ONDANSETRON HCL 4 MG PO TABS: 4.0000 mg | ORAL_TABLET | Freq: Two times a day (BID) | ORAL | 1 refills | Status: AC | PRN

## 2015-12-27 MED ORDER — BACLOFEN 10 MG PO TABS: 10.0000 mg | ORAL_TABLET | Freq: Three times a day (TID) | ORAL | 1 refills | Status: AC

## 2015-12-27 MED ORDER — FAMOTIDINE 40 MG PO TABS: 20.0000 mg | ORAL_TABLET | Freq: Two times a day (BID) | ORAL | 1 refills | Status: AC

## 2015-12-27 NOTE — Progress Notes (Signed)
Subjective:    Patient ID: Stephanie Frank, female    DOB: 08/22/1945, 70 y.o.   MRN: [id number]  HPI  70 y/o female with pmh of anxiety, OA, depression, scoliosis, lumbar herniated disc presents for follow up of low back pain. Mainly on right side.  Pain started ~2010.  Denies inciting event.  Laying down improves the pain.  Activity exacerbates the pain.  Describes as achy and burning. Radiates at times to her anterior chest. Constant.  Associated muscle spasms, numbness, tingling, weakness in right leg.  She has recently moved to the area and her husband passed 04/2014.  Previously she was on dilaudid and then switched to Oxycodone, but that makes her "violently ill".  She has tried Ambulance person as well.  She had trigger point injections with some benefit.  She had 1 fall in the last year due to slipping on soap.  Pain limits pt from doing "everything".  Of note, pt's previous physician was weaning narcotics and benzos.    Last clinic visit 11/08/15. She was sick on last visit and did not go to see any physicians as she was supposed to.  Pt has not changed her foot ware.  She states she has not been able to get to anywhere to change this.  Pt tried the TENS unit and she states it helped some.  She still never had her records from [facility] sent.  Pt still has not gone to PT.  Pt has not followed up with spine surgery as she was supposed to.  Pt continues to take tramadol 50 5/day.  The Baclofen is helping.  She continues to GI symptoms, but constipation has improved.  Pt is not taking Cymbalta.  Overall, pt states she is about the same, although functionally tells me she is doing more.  She states she is considering SCS.    Pain Inventory Average Pain 7 Pain Right Now 9 My pain is constant, sharp, burning, stabbing and aching  In the last 24 hours, has pain interfered with the following? General activity 5 Relation with others 5 Enjoyment of life 5 What TIME of day is your pain at its worst? . Sleep (in  general) .  Pain is worse with: unsure Pain improves with: rest, heat/ice and medication Relief from Meds: 5  Mobility walk without assistance ability to climb steps?  yes do you drive?  yes Do you have any goals in this area?  yes  Function retired  Neuro/Psych spasms depression  Prior Studies Any changes since last visit?  no  Physicians involved in your care Any changes since last visit?  no   Family History  Problem Relation Age of Onset  . Adopted: Yes  . Family history unknown: Yes   Social History   Social History  . Marital status: Widowed    Spouse name: N/A  . Number of children: N/A  . Years of education: N/A   Social History Main Topics  . Smoking status: Light Tobacco Smoker  . Smokeless tobacco: Never Used  . Alcohol use No  . Drug use: No  . Sexual activity: Not Asked   Other Topics Concern  . None   Social History Narrative  . None   Past Surgical History:  Procedure Laterality Date  . CHOLECYSTECTOMY     Past Medical History:  Diagnosis Date  . Allergy   . Anxiety   . Arthritis   . Cataract   . Depression   . Scoliosis    BP  117/79 (BP Location: Left Arm, Patient Position: Sitting, Cuff Size: Normal)   Pulse 94   SpO2 95%   Opioid Risk Score:   Fall Risk Score:  `1  Depression screen PHQ 2/9  Depression screen [facility] 2/9 10/02/2015 09/14/2015  Decreased Interest 0 1  Down, Depressed, Hopeless 0 1  PHQ - 2 Score 0 2  Altered sleeping - 3  Tired, decreased energy - 3  Change in appetite - 1  Feeling bad or failure about yourself  - 0  Trouble concentrating - 0  Moving slowly or fidgety/restless - 0  Suicidal thoughts - 0  PHQ-9 Score - 9    Review of Systems  Constitutional: Negative.   HENT: Negative.   Eyes: Negative.   Respiratory: Positive for apnea, cough and wheezing.   Cardiovascular: Negative.   Gastrointestinal: Positive for abdominal pain, constipation, diarrhea and nausea.  Endocrine: Negative.     Genitourinary: Negative.   Musculoskeletal: Positive for back pain.  Skin: Positive for color change and rash.  Neurological: Negative.        Spasms   Hematological: Negative.   Psychiatric/Behavioral: Positive for dysphoric mood.  All other systems reviewed and are negative.     Objective:   Physical Exam  Gen: NAD. Vital signs reviewed HENT: Normocephalic, Atraumatic Eyes: EOMI.  No discharge.  Cardio: RRR. No JVD. Pulm: B/l clear to auscultation.  Effort normal Abd: Soft, BS+ MSK:  Gait WNL.   TTP over lumbar spine R>L (improved).    No edema.   ROM limited due to pain (improved) Neuro:   Sensation intact to light touch in all LE dermatomes  Reflexes appear 2+ throughout, limited due to pain  Strength    4/5 in all LE myotomes (pain inhibition)  SLR neg  Skin: Warm and Dry. Intact. Psych: Anxious (improved) (slightly improved)    Assessment & Plan:  70 y/o female with pmh of anxiety, OA, depression, scoliosis, lumbar herniated disc presents for follow up of low back pain.  1. Chronic mechanical low back pain with scoliosis  MRI 09/2015 showing moderately severe lumbar dextroscoliosis, multilevel disc degeneration, advanced in the upper lumbar spine with mild-to-moderate neural foraminal lateral recess  Skelaxin has not provided relief  Not willing to try Lidoderm patches due to previous rash  Educated on footware (again, she states she has not had time)  Cont heat  Cont TENS   Pt has brace, cont limited bracing  Requested pt to send records from [place] of injections (for the third time)  Encouraged pt to follow up with PT for core strengthening and stretching and aquatic therapy and evaluation of leg leg length discrepancy (referral previously made, encouraged for 3rd time)  Pt currently being evaluated by surgery (has not made an appointment)  Cont tramadol to 50 4/day PRN - pt has had benefit, educated on signs symptoms of serotonin syndrome  Cont Baclofen 10mg  TID  PRN  Pt now willing to try Cymbalta (although she has stated this in the past, but still has not tried)  Pt has been noncomplaint with all recommendations, but states she agrees and knows she needs to do better  2. Myalgias  Will hold off on injections due illness  3. Opiod induced constipation with hx of constipation  Improving  Cont Miralax BID  Cont Senna - S 2 tab BID  Pt is interested in seeing GI for what she thinks may be IBS (but still has not gone)  4. Sleep disturbance  See #1  Pt using old mattress, encouraged replacement (states she still has not done anything)  Pt has stopped Gabapentin  5. Depression  Changed effexor to Cymbalta per pt request  Pt states she is willing to try Cymbalta now (has stated this in the past)  6. GERD  Cont Pepcid 40mg  daily, last refill given, pt told to follow up with GI  7. Nausea  Cont Zofran, last refill given.  Pt told to follow up with GI.

## 2016-02-01 DIAGNOSIS — M791 Myalgia: Secondary | ICD-10-CM | POA: Diagnosis not present

## 2016-02-01 DIAGNOSIS — G894 Chronic pain syndrome: Secondary | ICD-10-CM | POA: Diagnosis not present

## 2016-02-01 DIAGNOSIS — M4316 Spondylolisthesis, lumbar region: Secondary | ICD-10-CM | POA: Diagnosis not present

## 2016-02-01 DIAGNOSIS — Z79891 Long term (current) use of opiate analgesic: Secondary | ICD-10-CM | POA: Diagnosis not present

## 2016-02-01 DIAGNOSIS — M47816 Spondylosis without myelopathy or radiculopathy, lumbar region: Secondary | ICD-10-CM | POA: Diagnosis not present

## 2016-02-01 DIAGNOSIS — M4125 Other idiopathic scoliosis, thoracolumbar region: Secondary | ICD-10-CM | POA: Diagnosis not present

## 2016-02-01 DIAGNOSIS — Z79899 Other long term (current) drug therapy: Secondary | ICD-10-CM | POA: Diagnosis not present

## 2016-02-10 ENCOUNTER — Emergency Department (HOSPITAL_COMMUNITY)
Admission: EM | Admit: 2016-02-10 | Discharge: 2016-02-12 | Disposition: A | Discharge status: Other Acute Inpatient Hospital | Payer: Medicare Other | Attending: Emergency Medicine | Admitting: Emergency Medicine

## 2016-02-10 ENCOUNTER — Emergency Department (HOSPITAL_COMMUNITY): Payer: Medicare Other

## 2016-02-10 ENCOUNTER — Encounter (HOSPITAL_COMMUNITY): Payer: Self-pay | Admitting: Emergency Medicine

## 2016-02-10 DIAGNOSIS — Z9049 Acquired absence of other specified parts of digestive tract: Secondary | ICD-10-CM | POA: Diagnosis not present

## 2016-02-10 DIAGNOSIS — Z87891 Personal history of nicotine dependence: Secondary | ICD-10-CM | POA: Diagnosis not present

## 2016-02-10 DIAGNOSIS — F29 Unspecified psychosis not due to a substance or known physiological condition: Secondary | ICD-10-CM | POA: Insufficient documentation

## 2016-02-10 DIAGNOSIS — M4120 Other idiopathic scoliosis, site unspecified: Secondary | ICD-10-CM | POA: Diagnosis not present

## 2016-02-10 DIAGNOSIS — Z01818 Encounter for other preprocedural examination: Secondary | ICD-10-CM | POA: Diagnosis not present

## 2016-02-10 DIAGNOSIS — F323 Major depressive disorder, single episode, severe with psychotic features: Secondary | ICD-10-CM | POA: Diagnosis present

## 2016-02-10 DIAGNOSIS — Z0000 Encounter for general adult medical examination without abnormal findings: Secondary | ICD-10-CM

## 2016-02-10 DIAGNOSIS — F17200 Nicotine dependence, unspecified, uncomplicated: Secondary | ICD-10-CM | POA: Insufficient documentation

## 2016-02-10 DIAGNOSIS — Z888 Allergy status to other drugs, medicaments and biological substances status: Secondary | ICD-10-CM | POA: Diagnosis not present

## 2016-02-10 DIAGNOSIS — Z79899 Other long term (current) drug therapy: Secondary | ICD-10-CM | POA: Diagnosis not present

## 2016-02-10 DIAGNOSIS — R410 Disorientation, unspecified: Secondary | ICD-10-CM | POA: Diagnosis not present

## 2016-02-10 LAB — RAPID URINE DRUG SCREEN, HOSP PERFORMED
AMPHETAMINES: NOT DETECTED
Barbiturates: NOT DETECTED
Benzodiazepines: NOT DETECTED
Cocaine: NOT DETECTED
OPIATES: NOT DETECTED
TETRAHYDROCANNABINOL: NOT DETECTED

## 2016-02-10 LAB — URINALYSIS, ROUTINE W REFLEX MICROSCOPIC
Bilirubin Urine: NEGATIVE
Glucose, UA: NEGATIVE mg/dL
HGB URINE DIPSTICK: NEGATIVE
KETONES UR: NEGATIVE mg/dL
Nitrite: NEGATIVE
PH: 6 (ref 5.0–8.0)
Protein, ur: NEGATIVE mg/dL
Specific Gravity, Urine: 1.01 (ref 1.005–1.030)

## 2016-02-10 LAB — COMPREHENSIVE METABOLIC PANEL
ALK PHOS: 80 U/L (ref 38–126)
ALT: 12 U/L — AB (ref 14–54)
AST: 21 U/L (ref 15–41)
Albumin: 4.1 g/dL (ref 3.5–5.0)
Anion gap: 7 (ref 5–15)
BUN: 18 mg/dL (ref 6–20)
CALCIUM: 8.9 mg/dL (ref 8.9–10.3)
CHLORIDE: 103 mmol/L (ref 101–111)
CO2: 23 mmol/L (ref 22–32)
CREATININE: 1.33 mg/dL — AB (ref 0.44–1.00)
GFR calc non Af Amer: 39 mL/min — ABNORMAL LOW (ref >60)
GFR, EST AFRICAN AMERICAN: 46 mL/min — AB (ref >60)
Glucose, Bld: 107 mg/dL — ABNORMAL HIGH (ref 65–99)
Potassium: 3.5 mmol/L (ref 3.5–5.1)
Sodium: 133 mmol/L — ABNORMAL LOW (ref 135–145)
Total Bilirubin: 0.6 mg/dL (ref 0.3–1.2)
Total Protein: 6.9 g/dL (ref 6.5–8.1)

## 2016-02-10 LAB — CBC WITH DIFFERENTIAL/PLATELET
BASOS ABS: 0 10*3/uL (ref 0.0–0.1)
Basophils Relative: 0 %
Eosinophils Absolute: 0.1 10*3/uL (ref 0.0–0.7)
Eosinophils Relative: 1 %
HEMATOCRIT: 36.3 % (ref 36.0–46.0)
HEMOGLOBIN: 11.9 g/dL — AB (ref 12.0–15.0)
LYMPHS ABS: 1.8 10*3/uL (ref 0.7–4.0)
LYMPHS PCT: 16 %
MCH: 26.9 pg (ref 26.0–34.0)
MCHC: 32.8 g/dL (ref 30.0–36.0)
MCV: 81.9 fL (ref 78.0–100.0)
Monocytes Absolute: 0.8 10*3/uL (ref 0.1–1.0)
Monocytes Relative: 8 %
NEUTROS ABS: 8.1 10*3/uL — AB (ref 1.7–7.7)
NEUTROS PCT: 75 %
Platelets: 483 10*3/uL — ABNORMAL HIGH (ref 150–400)
RBC: 4.43 MIL/uL (ref 3.87–5.11)
RDW: 15.1 % (ref 11.5–15.5)
WBC: 10.8 10*3/uL — AB (ref 4.0–10.5)

## 2016-02-10 LAB — ETHANOL

## 2016-02-10 MED ORDER — CEPHALEXIN 500 MG PO CAPS
500.0000 mg | ORAL_CAPSULE | Freq: Once | ORAL | Status: AC
  Administered 2016-02-11: 500 mg via ORAL
  Filled 2016-02-10: qty 1

## 2016-02-10 MED ORDER — LORAZEPAM 2 MG/ML IJ SOLN: 1.0000 mg | Freq: Once | INTRAMUSCULAR | Status: DC

## 2016-02-10 MED ORDER — TRAMADOL HCL 50 MG PO TABS
50.0000 mg | ORAL_TABLET | Freq: Once | ORAL | Status: AC
  Administered 2016-02-10: 50 mg via ORAL
  Filled 2016-02-10: qty 1

## 2016-02-10 MED ORDER — LORAZEPAM 2 MG/ML IJ SOLN
0.0000 mg | Freq: Four times a day (QID) | INTRAMUSCULAR | Status: DC
  Administered 2016-02-10: 1 mg via INTRAVENOUS
  Filled 2016-02-10: qty 1

## 2016-02-10 MED ORDER — LORAZEPAM 2 MG/ML IJ SOLN: 0.0000 mg | Freq: Two times a day (BID) | INTRAMUSCULAR | Status: DC

## 2016-02-10 MED ORDER — SODIUM CHLORIDE 0.9 % IV BOLUS (SEPSIS)
500.0000 mL | Freq: Once | INTRAVENOUS | Status: AC
  Administered 2016-02-10: 500 mL via INTRAVENOUS

## 2016-02-10 MED ORDER — HALOPERIDOL LACTATE 5 MG/ML IJ SOLN
5.0000 mg | Freq: Once | INTRAMUSCULAR | Status: AC | PRN
  Administered 2016-02-10: 5 mg via INTRAMUSCULAR
  Filled 2016-02-10: qty 1

## 2016-02-10 MED ORDER — LORAZEPAM 1 MG PO TABS
1.0000 mg | ORAL_TABLET | Freq: Once | ORAL | Status: AC
  Administered 2016-02-10: 1 mg via ORAL
  Filled 2016-02-10: qty 1

## 2016-02-10 MED ORDER — ONDANSETRON HCL 4 MG PO TABS
4.0000 mg | ORAL_TABLET | Freq: Once | ORAL | Status: AC
  Administered 2016-02-10: 4 mg via ORAL
  Filled 2016-02-10: qty 1

## 2016-02-10 MED ORDER — NICOTINE 21 MG/24HR TD PT24
21.0000 mg | MEDICATED_PATCH | Freq: Once | TRANSDERMAL | Status: DC
  Administered 2016-02-11: 21 mg via TRANSDERMAL
  Filled 2016-02-10: qty 1

## 2016-02-10 NOTE — ED Notes (Signed)
Pt placed in paper scrubs.  Pt is confused and is constantly getting OOB.  Pt is easily redirected.
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
## 2016-02-10 NOTE — ED Notes (Signed)
Unable to give  Cephalexin , pt. Asleep upon thins time.

## 2016-02-10 NOTE — ED Notes (Signed)
Dr Lacinda Axon at bedside with pt.
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
## 2016-02-10 NOTE — ED Notes (Signed)
Pt continues to get OOB and is very confused.  It appears that she is starting to become agitated.

## 2016-02-10 NOTE — ED Notes (Signed)
Bed: WA29 Expected date:  Expected time:  Means of arrival:  Comments: Rm 18

## 2016-02-10 NOTE — ED Notes (Signed)
Pt went to bathroom and stated unable to give sample.

## 2016-02-10 NOTE — ED Notes (Signed)
Rockne Coons [phone]- pt son

## 2016-02-10 NOTE — ED Notes (Signed)
Pt has one belonging bag.  One yellow ring with black stone placed in a cup and placed in her belonging bag.  Placed in locker # 34.

## 2016-02-10 NOTE — ED Notes (Signed)
Pt ambulated to the room with steady gait.  Pt was still in her clothes

## 2016-02-10 NOTE — ED Triage Notes (Addendum)
Pt brought in by [employer] after finding pt walking around in cold w/o shoes in freezing rain. Per [employer], pt not making sense when she talks to them. Pt thinks that someone is trying to kill her and that her older son killed her older son. She adds that she wanted to go to police dept to get a cup of coffee. Pt is delusional per [employer] and not of sound mind and feels that she is unable to care for herself properly. Pt is ambulatory and in NAD. Pt denies hx of the same

## 2016-02-10 NOTE — ED Notes (Signed)
Pt stated unable to give urine sample at this time. Pt given water.

## 2016-02-10 NOTE — ED Notes (Signed)
Bed: [id number] Expected date:  Expected time:  Means of arrival:  Comments: [employer]

## 2016-02-10 NOTE — ED Notes (Signed)
[place] (Son) [phone]

## 2016-02-11 ENCOUNTER — Emergency Department (HOSPITAL_COMMUNITY): Payer: Medicare Other

## 2016-02-11 DIAGNOSIS — M4120 Other idiopathic scoliosis, site unspecified: Secondary | ICD-10-CM | POA: Diagnosis not present

## 2016-02-11 DIAGNOSIS — Z888 Allergy status to other drugs, medicaments and biological substances status: Secondary | ICD-10-CM

## 2016-02-11 DIAGNOSIS — Z9049 Acquired absence of other specified parts of digestive tract: Secondary | ICD-10-CM

## 2016-02-11 DIAGNOSIS — Z01818 Encounter for other preprocedural examination: Secondary | ICD-10-CM | POA: Diagnosis not present

## 2016-02-11 DIAGNOSIS — Z87891 Personal history of nicotine dependence: Secondary | ICD-10-CM

## 2016-02-11 DIAGNOSIS — Z79899 Other long term (current) drug therapy: Secondary | ICD-10-CM | POA: Diagnosis not present

## 2016-02-11 DIAGNOSIS — F323 Major depressive disorder, single episode, severe with psychotic features: Secondary | ICD-10-CM | POA: Diagnosis not present

## 2016-02-11 DIAGNOSIS — F29 Unspecified psychosis not due to a substance or known physiological condition: Secondary | ICD-10-CM | POA: Diagnosis not present

## 2016-02-11 MED ORDER — ONDANSETRON 4 MG PO TBDP
4.0000 mg | ORAL_TABLET | Freq: Once | ORAL | Status: AC
  Administered 2016-02-11: 4 mg via ORAL
  Filled 2016-02-11: qty 1

## 2016-02-11 MED ORDER — CEPHALEXIN 500 MG PO CAPS
500.0000 mg | ORAL_CAPSULE | Freq: Two times a day (BID) | ORAL | Status: DC
  Administered 2016-02-11 – 2016-02-12 (×3): 500 mg via ORAL
  Filled 2016-02-11 (×3): qty 1

## 2016-02-11 MED ORDER — TRAMADOL HCL 50 MG PO TABS
50.0000 mg | ORAL_TABLET | Freq: Four times a day (QID) | ORAL | Status: DC | PRN
  Administered 2016-02-11 – 2016-02-12 (×3): 50 mg via ORAL
  Filled 2016-02-11 (×2): qty 1

## 2016-02-11 MED ORDER — QUETIAPINE FUMARATE 25 MG PO TABS
25.0000 mg | ORAL_TABLET | Freq: Every day | ORAL | Status: DC
  Administered 2016-02-11: 25 mg via ORAL
  Filled 2016-02-11: qty 1

## 2016-02-11 MED ORDER — ONDANSETRON 4 MG PO TBDP
4.0000 mg | ORAL_TABLET | Freq: Three times a day (TID) | ORAL | Status: DC | PRN
  Administered 2016-02-11: 4 mg via ORAL
  Filled 2016-02-11 (×2): qty 1

## 2016-02-11 MED ORDER — FLUOXETINE HCL 10 MG PO CAPS
10.0000 mg | ORAL_CAPSULE | Freq: Every day | ORAL | Status: DC
  Administered 2016-02-11 – 2016-02-12 (×2): 10 mg via ORAL
  Filled 2016-02-11 (×2): qty 1

## 2016-02-11 MED ORDER — TRAMADOL HCL 50 MG PO TABS
50.0000 mg | ORAL_TABLET | Freq: Two times a day (BID) | ORAL | Status: DC | PRN
  Administered 2016-02-11: 50 mg via ORAL
  Filled 2016-02-11 (×2): qty 1

## 2016-02-11 MED ORDER — IBUPROFEN 200 MG PO TABS
400.0000 mg | ORAL_TABLET | Freq: Three times a day (TID) | ORAL | Status: DC | PRN
  Administered 2016-02-11: 400 mg via ORAL
  Filled 2016-02-11: qty 2
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
## 2016-02-11 NOTE — ED Notes (Addendum)
Pt's family members have  been calling repeatedly this Probation officer for update. Spoke to 2 sons and one daughter in law, this last requested to repeat UA and demanded to perform an in out cath to rule out UTI. The writer spent an average 20 minutes talks on the phone to son and another 61 minute with daughter in Sports coach. The writer suggested to one of the sons to communicate with other family members and come up with a plan .  Dr Wilson Singer made aware about family request and he is at bedside to reassess pt .

## 2016-02-11 NOTE — BH Assessment (Addendum)
[place] Assessment Progress Note  Per Corena Pilgrim, MD, this pt requires psychiatric hospitalization at this time.  Pt presents under IVC initiated by EDP Nat Christen, MD.  At 15:08 Pamala Hurry calls from [facility] to report that pt has been accepted to their facility by Dr Elnora Morrison to the [facility], Rm 141.  Pt must arrive either before 23:00 tonight or after 06:00 tomorrow.  Pt's nurse has been notified, and agrees to call report to [phone].  Pt is to be transported via [facility].  Jalene Mullet, [place] Triage Specialist [phone]

## 2016-02-11 NOTE — ED Notes (Signed)
Pt's son [name] called again this Therapist, sports. Pt is demanding to have document signed by pt so we can provide the family with pt's medical care. I explained that MD was at bedside and pt refuses to talk or give any information to the listed family members, Jerolyn Center and lindsey. The son was extremely upset with this RN. Transferred call to CN.

## 2016-02-11 NOTE — Progress Notes (Signed)
CSW received call from patients son, [name], regarding power of attorney/ guardianship/ consent to release. Patients son requested CSW to have patient sign power of attorney information. CSW informed son that CSW does not complete power of attorney information and that patient would have to be oriented to sign- which she is not. CSW asked if patient would like to sign consent of release however patient stated she would like to think about it. CSW will follow up with patient regarding consent to release information.   Kingsley Spittle, LCSWA Clinical Social Worker [phone]

## 2016-02-11 NOTE — ED Notes (Addendum)
Pt on phone with son, [name].  Pt requesting this Probation officer to speak to son.  Son requesting MD call when he sees pt in the morning & speak to pt & him at the same time.  Informed would place note in chart.

## 2016-02-11 NOTE — ED Provider Notes (Signed)
Asked by nursing to update pt's family and clarify some information. Apparently daughter-in-law(?) Benjamine Mola [phone]) had questions and was requesting a catheterized urine. Labs reviewed. May have UTI. Given a dose of keflex yesterday. Ordered scheduled dosing now. Urine culture already sent. I do not see the need to obtain another sample at this time. I then spoke with patient. She does not want me talking with her family. She specifically mentioned Benjamine Mola and [name]. I asked her if there is anyone she would allow me to discuss her care with and she said "no."    Virgel Manifold, MD 02/11/16 1330

## 2016-02-11 NOTE — ED Notes (Signed)
Pt has an accepted bed at [facility], [place]. Spoke to Anna and they are unable to transport female pt during night due to staffing issue. Sheriff has the pt's information and where they will be transported. Transport is scheduled in the morning between 8 Am and 0830 AM. They will call 30 minutes prior to arrival.
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
## 2016-02-11 NOTE — Progress Notes (Deleted)
Patient has a bed at [facility] and they are able to take patient today.    [id number] ex.[place] MP: Dr. Carmelia Bake 900

## 2016-02-12 DIAGNOSIS — M545 Low back pain: Secondary | ICD-10-CM | POA: Diagnosis present

## 2016-02-12 DIAGNOSIS — Z79899 Other long term (current) drug therapy: Secondary | ICD-10-CM | POA: Diagnosis not present

## 2016-02-12 DIAGNOSIS — N3000 Acute cystitis without hematuria: Secondary | ICD-10-CM | POA: Diagnosis present

## 2016-02-12 DIAGNOSIS — Z880 Allergy status to penicillin: Secondary | ICD-10-CM | POA: Diagnosis not present

## 2016-02-12 DIAGNOSIS — M5136 Other intervertebral disc degeneration, lumbar region: Secondary | ICD-10-CM | POA: Diagnosis present

## 2016-02-12 DIAGNOSIS — F323 Major depressive disorder, single episode, severe with psychotic features: Secondary | ICD-10-CM | POA: Diagnosis not present

## 2016-02-12 DIAGNOSIS — F17200 Nicotine dependence, unspecified, uncomplicated: Secondary | ICD-10-CM | POA: Diagnosis not present

## 2016-02-12 DIAGNOSIS — K219 Gastro-esophageal reflux disease without esophagitis: Secondary | ICD-10-CM | POA: Diagnosis present

## 2016-02-12 DIAGNOSIS — F333 Major depressive disorder, recurrent, severe with psychotic symptoms: Secondary | ICD-10-CM | POA: Diagnosis present

## 2016-02-12 DIAGNOSIS — F17290 Nicotine dependence, other tobacco product, uncomplicated: Secondary | ICD-10-CM | POA: Diagnosis present

## 2016-02-12 DIAGNOSIS — G8929 Other chronic pain: Secondary | ICD-10-CM | POA: Diagnosis present

## 2016-02-12 DIAGNOSIS — Z885 Allergy status to narcotic agent status: Secondary | ICD-10-CM | POA: Diagnosis not present

## 2016-02-12 DIAGNOSIS — Z23 Encounter for immunization: Secondary | ICD-10-CM | POA: Diagnosis not present

## 2016-02-12 DIAGNOSIS — F29 Unspecified psychosis not due to a substance or known physiological condition: Secondary | ICD-10-CM | POA: Diagnosis not present

## 2016-02-12 LAB — URINE CULTURE: Culture: <10000 — AB

## 2016-02-12 NOTE — ED Notes (Signed)
Report called to [employer], Margrett Rud RN.

## 2016-02-12 NOTE — ED Notes (Signed)
Patient up to use the phone this morning before breakfast.

## 2016-02-12 NOTE — ED Notes (Addendum)
Pt has been to desk multiple times requesting to see "the doctor" d/t not understanding why she is here.  Pt stated "I don't know why I'm here."  Pt stating "the light outside my door and all the noise, I can't sleep.  I can't close my door.  Everybody's laughing."  Department has been quiet with normal movement & activity of staff.

## 2016-02-20 DIAGNOSIS — F3342 Major depressive disorder, recurrent, in full remission: Secondary | ICD-10-CM | POA: Diagnosis not present

## 2016-02-27 DIAGNOSIS — M47816 Spondylosis without myelopathy or radiculopathy, lumbar region: Secondary | ICD-10-CM | POA: Diagnosis not present

## 2016-02-28 ENCOUNTER — Encounter: Payer: Medicare Other | Attending: Physical Medicine & Rehabilitation | Admitting: Physical Medicine & Rehabilitation

## 2016-02-28 DIAGNOSIS — M1990 Unspecified osteoarthritis, unspecified site: Secondary | ICD-10-CM | POA: Insufficient documentation

## 2016-02-28 DIAGNOSIS — Z9049 Acquired absence of other specified parts of digestive tract: Secondary | ICD-10-CM | POA: Insufficient documentation

## 2016-02-28 DIAGNOSIS — M791 Myalgia: Secondary | ICD-10-CM | POA: Insufficient documentation

## 2016-02-28 DIAGNOSIS — G8929 Other chronic pain: Secondary | ICD-10-CM | POA: Insufficient documentation

## 2016-02-28 DIAGNOSIS — G479 Sleep disorder, unspecified: Secondary | ICD-10-CM | POA: Insufficient documentation

## 2016-02-28 DIAGNOSIS — K5900 Constipation, unspecified: Secondary | ICD-10-CM | POA: Insufficient documentation

## 2016-02-28 DIAGNOSIS — M419 Scoliosis, unspecified: Secondary | ICD-10-CM | POA: Insufficient documentation

## 2016-02-28 DIAGNOSIS — F17210 Nicotine dependence, cigarettes, uncomplicated: Secondary | ICD-10-CM | POA: Insufficient documentation

## 2016-02-28 DIAGNOSIS — F419 Anxiety disorder, unspecified: Secondary | ICD-10-CM | POA: Insufficient documentation

## 2016-02-28 DIAGNOSIS — T402X5A Adverse effect of other opioids, initial encounter: Secondary | ICD-10-CM | POA: Insufficient documentation

## 2016-02-28 DIAGNOSIS — M545 Low back pain: Secondary | ICD-10-CM | POA: Insufficient documentation

## 2016-02-28 DIAGNOSIS — F329 Major depressive disorder, single episode, unspecified: Secondary | ICD-10-CM | POA: Insufficient documentation

## 2016-02-28 DIAGNOSIS — R200 Anesthesia of skin: Secondary | ICD-10-CM | POA: Insufficient documentation

## 2016-03-06 DIAGNOSIS — R109 Unspecified abdominal pain: Secondary | ICD-10-CM | POA: Diagnosis not present

## 2016-03-06 DIAGNOSIS — N390 Urinary tract infection, site not specified: Secondary | ICD-10-CM | POA: Diagnosis not present

## 2016-03-06 DIAGNOSIS — K219 Gastro-esophageal reflux disease without esophagitis: Secondary | ICD-10-CM | POA: Diagnosis not present

## 2016-03-06 DIAGNOSIS — K5900 Constipation, unspecified: Secondary | ICD-10-CM | POA: Diagnosis not present

## 2016-03-14 DIAGNOSIS — M47816 Spondylosis without myelopathy or radiculopathy, lumbar region: Secondary | ICD-10-CM | POA: Diagnosis not present

## 2016-03-14 DIAGNOSIS — M4125 Other idiopathic scoliosis, thoracolumbar region: Secondary | ICD-10-CM | POA: Diagnosis not present

## 2016-03-14 DIAGNOSIS — M4316 Spondylolisthesis, lumbar region: Secondary | ICD-10-CM | POA: Diagnosis not present

## 2016-03-14 DIAGNOSIS — M791 Myalgia: Secondary | ICD-10-CM | POA: Diagnosis not present

## 2016-04-14 DIAGNOSIS — R3911 Hesitancy of micturition: Secondary | ICD-10-CM | POA: Diagnosis not present

## 2016-04-14 DIAGNOSIS — N390 Urinary tract infection, site not specified: Secondary | ICD-10-CM | POA: Diagnosis not present

## 2016-04-17 DIAGNOSIS — R3911 Hesitancy of micturition: Secondary | ICD-10-CM | POA: Diagnosis not present

## 2016-04-17 DIAGNOSIS — R7989 Other specified abnormal findings of blood chemistry: Secondary | ICD-10-CM | POA: Diagnosis not present

## 2016-04-17 DIAGNOSIS — N390 Urinary tract infection, site not specified: Secondary | ICD-10-CM | POA: Diagnosis not present

## 2016-04-20 DIAGNOSIS — M545 Low back pain: Secondary | ICD-10-CM | POA: Diagnosis not present

## 2016-04-24 DIAGNOSIS — M47816 Spondylosis without myelopathy or radiculopathy, lumbar region: Secondary | ICD-10-CM | POA: Diagnosis not present

## 2016-04-24 DIAGNOSIS — M4316 Spondylolisthesis, lumbar region: Secondary | ICD-10-CM | POA: Diagnosis not present

## 2016-04-24 DIAGNOSIS — M4125 Other idiopathic scoliosis, thoracolumbar region: Secondary | ICD-10-CM | POA: Diagnosis not present

## 2016-04-24 DIAGNOSIS — M791 Myalgia: Secondary | ICD-10-CM | POA: Diagnosis not present

## 2016-04-28 DIAGNOSIS — M47816 Spondylosis without myelopathy or radiculopathy, lumbar region: Secondary | ICD-10-CM | POA: Diagnosis not present

## 2016-05-09 DIAGNOSIS — M47816 Spondylosis without myelopathy or radiculopathy, lumbar region: Secondary | ICD-10-CM | POA: Diagnosis not present

## 2016-05-09 DIAGNOSIS — M4316 Spondylolisthesis, lumbar region: Secondary | ICD-10-CM | POA: Diagnosis not present

## 2016-05-09 DIAGNOSIS — M4125 Other idiopathic scoliosis, thoracolumbar region: Secondary | ICD-10-CM | POA: Diagnosis not present

## 2016-05-09 DIAGNOSIS — M791 Myalgia: Secondary | ICD-10-CM | POA: Diagnosis not present

## 2016-05-16 DIAGNOSIS — R109 Unspecified abdominal pain: Secondary | ICD-10-CM | POA: Diagnosis not present

## 2016-05-16 DIAGNOSIS — R3911 Hesitancy of micturition: Secondary | ICD-10-CM | POA: Diagnosis not present

## 2016-05-16 DIAGNOSIS — N390 Urinary tract infection, site not specified: Secondary | ICD-10-CM | POA: Diagnosis not present

## 2016-05-16 DIAGNOSIS — M549 Dorsalgia, unspecified: Secondary | ICD-10-CM | POA: Diagnosis not present

## 2016-06-06 DIAGNOSIS — M791 Myalgia: Secondary | ICD-10-CM | POA: Diagnosis not present

## 2016-06-06 DIAGNOSIS — Z79899 Other long term (current) drug therapy: Secondary | ICD-10-CM | POA: Diagnosis not present

## 2016-06-06 DIAGNOSIS — M4125 Other idiopathic scoliosis, thoracolumbar region: Secondary | ICD-10-CM | POA: Diagnosis not present

## 2016-06-06 DIAGNOSIS — Z79891 Long term (current) use of opiate analgesic: Secondary | ICD-10-CM | POA: Diagnosis not present

## 2016-06-06 DIAGNOSIS — G894 Chronic pain syndrome: Secondary | ICD-10-CM | POA: Diagnosis not present

## 2016-06-06 DIAGNOSIS — M47816 Spondylosis without myelopathy or radiculopathy, lumbar region: Secondary | ICD-10-CM | POA: Diagnosis not present

## 2016-06-06 DIAGNOSIS — M4316 Spondylolisthesis, lumbar region: Secondary | ICD-10-CM | POA: Diagnosis not present

## 2016-06-23 DIAGNOSIS — K3189 Other diseases of stomach and duodenum: Secondary | ICD-10-CM | POA: Diagnosis not present

## 2016-06-23 DIAGNOSIS — K449 Diaphragmatic hernia without obstruction or gangrene: Secondary | ICD-10-CM | POA: Diagnosis not present

## 2016-06-23 DIAGNOSIS — Z1211 Encounter for screening for malignant neoplasm of colon: Secondary | ICD-10-CM | POA: Diagnosis not present

## 2016-06-23 DIAGNOSIS — R112 Nausea with vomiting, unspecified: Secondary | ICD-10-CM | POA: Diagnosis not present

## 2016-06-23 DIAGNOSIS — K2970 Gastritis, unspecified, without bleeding: Secondary | ICD-10-CM | POA: Diagnosis not present

## 2016-06-23 DIAGNOSIS — D126 Benign neoplasm of colon, unspecified: Secondary | ICD-10-CM | POA: Diagnosis not present

## 2016-06-23 DIAGNOSIS — K2930 Chronic superficial gastritis without bleeding: Secondary | ICD-10-CM | POA: Diagnosis not present

## 2016-07-01 ENCOUNTER — Other Ambulatory Visit (HOSPITAL_COMMUNITY): Payer: Self-pay | Admitting: Gastroenterology

## 2016-07-01 DIAGNOSIS — R112 Nausea with vomiting, unspecified: Secondary | ICD-10-CM

## 2016-07-03 DIAGNOSIS — F331 Major depressive disorder, recurrent, moderate: Secondary | ICD-10-CM | POA: Diagnosis not present

## 2016-07-03 DIAGNOSIS — F410 Panic disorder [episodic paroxysmal anxiety] without agoraphobia: Secondary | ICD-10-CM | POA: Diagnosis not present

## 2016-07-07 ENCOUNTER — Encounter (HOSPITAL_COMMUNITY): Payer: Self-pay

## 2016-07-07 ENCOUNTER — Ambulatory Visit (HOSPITAL_COMMUNITY): Payer: Medicare Other | Attending: Gastroenterology

## 2016-07-11 DIAGNOSIS — M4316 Spondylolisthesis, lumbar region: Secondary | ICD-10-CM | POA: Diagnosis not present

## 2016-07-11 DIAGNOSIS — M4125 Other idiopathic scoliosis, thoracolumbar region: Secondary | ICD-10-CM | POA: Diagnosis not present

## 2016-07-11 DIAGNOSIS — M791 Myalgia: Secondary | ICD-10-CM | POA: Diagnosis not present

## 2016-07-11 DIAGNOSIS — M47816 Spondylosis without myelopathy or radiculopathy, lumbar region: Secondary | ICD-10-CM | POA: Diagnosis not present

## 2016-07-22 ENCOUNTER — Encounter (HOSPITAL_COMMUNITY): Payer: Self-pay | Admitting: Emergency Medicine

## 2016-07-22 ENCOUNTER — Ambulatory Visit (HOSPITAL_COMMUNITY)
Admission: EM | Admit: 2016-07-22 | Discharge: 2016-07-22 | Disposition: A | Discharge status: Home / Self Care | Payer: Medicare Other | Attending: Internal Medicine | Admitting: Internal Medicine

## 2016-07-22 DIAGNOSIS — M545 Low back pain, unspecified: Secondary | ICD-10-CM

## 2016-07-22 DIAGNOSIS — S7002XA Contusion of left hip, initial encounter: Secondary | ICD-10-CM | POA: Diagnosis not present

## 2016-07-22 DIAGNOSIS — W19XXXA Unspecified fall, initial encounter: Secondary | ICD-10-CM | POA: Diagnosis not present

## 2016-07-22 MED ORDER — HYDROCODONE-ACETAMINOPHEN 5-325 MG PO TABS
ORAL_TABLET | ORAL | Status: AC
  Filled 2016-07-22: qty 1

## 2016-07-22 MED ORDER — HYDROCODONE-ACETAMINOPHEN 5-325 MG PO TABS
1.0000 | ORAL_TABLET | Freq: Once | ORAL | Status: AC
  Administered 2016-07-22: 1 via ORAL

## 2016-07-22 NOTE — Discharge Instructions (Signed)
You were given a dose of hydrocodone-acetaminophen 5-325mg . Ice your back and hip throughout the day. Take Tylenol for pain. Continue your back brace. Follow up with you spine specialist for refills. If you experience any numbness/tingling of the inner thighs, loss of bladder or bowel control, go to the emergency department for further evaluation.

## 2016-07-22 NOTE — ED Triage Notes (Signed)
PT slipped and fell on ice in her kitchen this AM. PT fell flat on her back. PT reports back pain and hip pain. PT has bruise to left hip. PT is ambulatory

## 2016-07-22 NOTE — ED Provider Notes (Signed)
CSN: [id number]     Arrival date & time 07/22/16  1135 History   None    Chief Complaint  Patient presents with  . Fall   (Consider location/radiation/quality/duration/timing/severity/associated sxs/prior Treatment) 71 year old female with history of scoliosis and chronic back pain comes in for 1 day history of back pain exacerbation after a fall. Patient states slipped on ice this morning in the kitchen, then an on her left hip and back. She's tried some aspirin, ice, with no relief. Was able to ambulate on own. Patient states has some tingling of the toes, denies numbness/tingling of the inner thighs. Denies loss of bladder or bowel control. Patient states she usually takes tramadol and muscle relaxant for her back pain, but has not used any since the fall.      Past Medical History:  Diagnosis Date  . Allergy   . Anxiety   . Arthritis   . Cataract   . Depression   . Scoliosis    Past Surgical History:  Procedure Laterality Date  . CHOLECYSTECTOMY     Family History  Problem Relation Age of Onset  . Adopted: Yes  . Family history unknown: Yes   Social History  Substance Use Topics  . Smoking status: Light Tobacco Smoker  . Smokeless tobacco: Never Used  . Alcohol use No   OB History    No data available     Review of Systems  Musculoskeletal: Positive for arthralgias, back pain, gait problem and myalgias. Negative for joint swelling.  Skin: Positive for color change. Negative for wound.    Allergies  Codeine; Dairy aid [lactase]; Gluten meal; and Oxycodone  Home Medications   Prior to Admission medications   Medication Sig Start Date End Date Taking? Authorizing Provider  baclofen (LIORESAL) 10 MG tablet Take 10 mg by mouth 3 (three) times daily as needed for muscle spasms.    [provider]  famotidine (PEPCID) 40 MG tablet Take 0.5 tablets (20 mg total) by mouth 2 (two) times daily. 12/27/15 02/10/16  Jamse Arn, MD  ondansetron (ZOFRAN) 4 MG  tablet Take 1 tablet (4 mg total) by mouth 2 (two) times daily as needed for nausea or vomiting. 12/27/15   Jamse Arn, MD  polyethylene glycol [facility] / Floria Raveling) packet  09/25/15   [provider]  traMADol (ULTRAM) 50 MG tablet Take 1 tablet (50 mg total) by mouth every 6 (six) hours as needed. 12/27/15   Jamse Arn, MD   Meds Ordered and Administered this Visit   Medications  HYDROcodone-acetaminophen (NORCO/VICODIN) 5-325 MG per tablet 1 tablet (1 tablet Oral Given 07/22/16 1247)    BP 128/82   Pulse 88   Temp 98.1 F (36.7 C) (Oral)   Resp 16   Ht 5\' 5"  (1.651 m)   Wt 118 lb (53.5 kg)   SpO2 96%   BMI 19.64 kg/m  No data found.   Physical Exam  Constitutional: She is oriented to person, place, and time. She appears well-developed and well-nourished.  Mild distress due to pain  HENT:  Head: Normocephalic and atraumatic.  Eyes: Pupils are equal, round, and reactive to light. Conjunctivae are normal.  Cardiovascular: Normal rate, regular rhythm and normal heart sounds.  Exam reveals no gallop and no friction rub.   No murmur heard. Pulmonary/Chest: Effort normal and breath sounds normal. She has no wheezes. She has no rales.  Musculoskeletal:  Back without contusions. Tender on palpation of the midline and bilateral lower back starting  at lumbar region. Patient with scoliosis.   Left hip contusion. Tenderness on palpation of the left hip, no tenderness of the right hip. Decreased active ROM, Full passive ROM, although with pain. Negative straight leg raise.   Stregnth testing deferred due to pain. Sensation intact.  Neurological: She is alert and oriented to person, place, and time.  Skin: Skin is warm and dry.    Urgent Care Course     Procedures (including critical care time)  Labs Review Labs Reviewed - No data to display  Imaging Review No results found.    MDM   1. Fall, initial encounter   2. Acute bilateral low back pain  without sciatica    [place] substance database reviewed, upon further questioning of Vicodin use, patient admits to having prescription from spine specialist. She states that she has monthly supplies, but will not be able to have prescription filled till tomorrow. Given age and past GFR of 64, will defer Toradol use. Patient's last dose of Vicodin yesterday. Given recent injury, seems reasonable for one dose of Vicodin at clinic. Discussed with patient will not be able to provide prescription. Patient to contact spine specialist for further management of back pain. Monitor for numbness/tingling, loss of bladder or bowel control, to go to the ED immediately. Patient express understanding and agrees to plan.     Ok Edwards, PA-C 07/22/16 1306

## 2016-08-11 DIAGNOSIS — M7062 Trochanteric bursitis, left hip: Secondary | ICD-10-CM | POA: Diagnosis not present

## 2016-08-11 DIAGNOSIS — M545 Low back pain: Secondary | ICD-10-CM | POA: Diagnosis not present

## 2016-08-11 DIAGNOSIS — M47816 Spondylosis without myelopathy or radiculopathy, lumbar region: Secondary | ICD-10-CM | POA: Diagnosis not present

## 2016-08-11 DIAGNOSIS — M25552 Pain in left hip: Secondary | ICD-10-CM | POA: Diagnosis not present

## 2016-08-18 DIAGNOSIS — M25552 Pain in left hip: Secondary | ICD-10-CM | POA: Diagnosis not present

## 2016-08-18 DIAGNOSIS — M545 Low back pain: Secondary | ICD-10-CM | POA: Diagnosis not present

## 2016-08-18 DIAGNOSIS — M47816 Spondylosis without myelopathy or radiculopathy, lumbar region: Secondary | ICD-10-CM | POA: Diagnosis not present

## 2016-09-09 DIAGNOSIS — F331 Major depressive disorder, recurrent, moderate: Secondary | ICD-10-CM | POA: Diagnosis not present

## 2016-09-09 DIAGNOSIS — M549 Dorsalgia, unspecified: Secondary | ICD-10-CM | POA: Diagnosis not present

## 2016-09-09 DIAGNOSIS — M47816 Spondylosis without myelopathy or radiculopathy, lumbar region: Secondary | ICD-10-CM | POA: Diagnosis not present

## 2016-10-13 DIAGNOSIS — M545 Low back pain: Secondary | ICD-10-CM | POA: Diagnosis not present

## 2016-10-13 DIAGNOSIS — M47816 Spondylosis without myelopathy or radiculopathy, lumbar region: Secondary | ICD-10-CM | POA: Diagnosis not present

## 2016-10-27 DIAGNOSIS — G894 Chronic pain syndrome: Secondary | ICD-10-CM | POA: Diagnosis not present

## 2016-10-27 DIAGNOSIS — M545 Low back pain: Secondary | ICD-10-CM | POA: Diagnosis not present

## 2016-10-27 DIAGNOSIS — M47816 Spondylosis without myelopathy or radiculopathy, lumbar region: Secondary | ICD-10-CM | POA: Diagnosis not present

## 2016-11-10 DIAGNOSIS — M4316 Spondylolisthesis, lumbar region: Secondary | ICD-10-CM | POA: Diagnosis not present

## 2016-11-10 DIAGNOSIS — Z79891 Long term (current) use of opiate analgesic: Secondary | ICD-10-CM | POA: Diagnosis not present

## 2016-11-10 DIAGNOSIS — M545 Low back pain: Secondary | ICD-10-CM | POA: Diagnosis not present

## 2016-11-10 DIAGNOSIS — G894 Chronic pain syndrome: Secondary | ICD-10-CM | POA: Diagnosis not present

## 2016-11-10 DIAGNOSIS — Z79899 Other long term (current) drug therapy: Secondary | ICD-10-CM | POA: Diagnosis not present

## 2016-11-14 DIAGNOSIS — R42 Dizziness and giddiness: Secondary | ICD-10-CM | POA: Diagnosis not present

## 2016-11-14 DIAGNOSIS — N390 Urinary tract infection, site not specified: Secondary | ICD-10-CM | POA: Diagnosis not present

## 2016-12-08 DIAGNOSIS — Z6821 Body mass index (BMI) 21.0-21.9, adult: Secondary | ICD-10-CM | POA: Diagnosis not present

## 2016-12-08 DIAGNOSIS — G894 Chronic pain syndrome: Secondary | ICD-10-CM | POA: Diagnosis not present

## 2016-12-08 DIAGNOSIS — M545 Low back pain: Secondary | ICD-10-CM | POA: Diagnosis not present

## 2016-12-08 DIAGNOSIS — M47816 Spondylosis without myelopathy or radiculopathy, lumbar region: Secondary | ICD-10-CM | POA: Diagnosis not present

## 2016-12-31 DIAGNOSIS — M47816 Spondylosis without myelopathy or radiculopathy, lumbar region: Secondary | ICD-10-CM | POA: Diagnosis not present

## 2016-12-31 DIAGNOSIS — G894 Chronic pain syndrome: Secondary | ICD-10-CM | POA: Diagnosis not present

## 2016-12-31 DIAGNOSIS — M545 Low back pain: Secondary | ICD-10-CM | POA: Diagnosis not present

## 2017-01-14 DIAGNOSIS — M47817 Spondylosis without myelopathy or radiculopathy, lumbosacral region: Secondary | ICD-10-CM | POA: Diagnosis not present

## 2017-01-14 DIAGNOSIS — M47816 Spondylosis without myelopathy or radiculopathy, lumbar region: Secondary | ICD-10-CM | POA: Diagnosis not present

## 2017-01-20 DIAGNOSIS — M549 Dorsalgia, unspecified: Secondary | ICD-10-CM | POA: Diagnosis not present

## 2017-01-26 DIAGNOSIS — M545 Low back pain: Secondary | ICD-10-CM | POA: Diagnosis not present

## 2017-01-26 DIAGNOSIS — G894 Chronic pain syndrome: Secondary | ICD-10-CM | POA: Diagnosis not present

## 2017-01-26 DIAGNOSIS — M47816 Spondylosis without myelopathy or radiculopathy, lumbar region: Secondary | ICD-10-CM | POA: Diagnosis not present

## 2017-02-18 DIAGNOSIS — F331 Major depressive disorder, recurrent, moderate: Secondary | ICD-10-CM | POA: Diagnosis not present

## 2017-02-18 DIAGNOSIS — F410 Panic disorder [episodic paroxysmal anxiety] without agoraphobia: Secondary | ICD-10-CM | POA: Diagnosis not present

## 2017-02-18 DIAGNOSIS — R6881 Early satiety: Secondary | ICD-10-CM | POA: Diagnosis not present

## 2017-02-18 DIAGNOSIS — N183 Chronic kidney disease, stage 3 (moderate): Secondary | ICD-10-CM | POA: Diagnosis not present

## 2017-02-18 DIAGNOSIS — M549 Dorsalgia, unspecified: Secondary | ICD-10-CM | POA: Diagnosis not present

## 2017-02-23 DIAGNOSIS — G894 Chronic pain syndrome: Secondary | ICD-10-CM | POA: Diagnosis not present

## 2017-02-23 DIAGNOSIS — M545 Low back pain: Secondary | ICD-10-CM | POA: Diagnosis not present

## 2017-02-26 ENCOUNTER — Other Ambulatory Visit (HOSPITAL_COMMUNITY): Payer: Self-pay | Admitting: Gastroenterology

## 2017-02-26 DIAGNOSIS — D126 Benign neoplasm of colon, unspecified: Secondary | ICD-10-CM | POA: Diagnosis not present

## 2017-02-26 DIAGNOSIS — R198 Other specified symptoms and signs involving the digestive system and abdomen: Secondary | ICD-10-CM | POA: Diagnosis not present

## 2017-02-26 DIAGNOSIS — R109 Unspecified abdominal pain: Secondary | ICD-10-CM

## 2017-03-04 ENCOUNTER — Encounter (HOSPITAL_COMMUNITY)
Admission: RE | Admit: 2017-03-04 | Discharge: 2017-03-04 | Disposition: A | Discharge status: Home / Self Care | Payer: Medicare Other | Source: Ambulatory Visit | Attending: Gastroenterology | Admitting: Gastroenterology

## 2017-03-04 ENCOUNTER — Encounter (HOSPITAL_COMMUNITY): Payer: Self-pay

## 2017-03-04 DIAGNOSIS — Z79899 Other long term (current) drug therapy: Secondary | ICD-10-CM | POA: Diagnosis not present

## 2017-03-04 DIAGNOSIS — M545 Low back pain: Secondary | ICD-10-CM | POA: Diagnosis not present

## 2017-03-04 DIAGNOSIS — M419 Scoliosis, unspecified: Secondary | ICD-10-CM | POA: Diagnosis not present

## 2017-03-04 DIAGNOSIS — G8929 Other chronic pain: Secondary | ICD-10-CM | POA: Diagnosis not present

## 2017-03-18 DIAGNOSIS — M545 Low back pain: Secondary | ICD-10-CM | POA: Diagnosis not present

## 2017-03-18 DIAGNOSIS — Z79899 Other long term (current) drug therapy: Secondary | ICD-10-CM | POA: Diagnosis not present

## 2017-03-18 DIAGNOSIS — G8929 Other chronic pain: Secondary | ICD-10-CM | POA: Diagnosis not present

## 2017-03-18 DIAGNOSIS — M419 Scoliosis, unspecified: Secondary | ICD-10-CM | POA: Diagnosis not present

## 2017-03-20 ENCOUNTER — Ambulatory Visit (HOSPITAL_COMMUNITY): Payer: Medicare Other | Attending: Gastroenterology

## 2017-03-20 ENCOUNTER — Encounter (HOSPITAL_COMMUNITY): Payer: Self-pay

## 2017-04-17 DIAGNOSIS — G8929 Other chronic pain: Secondary | ICD-10-CM | POA: Diagnosis not present

## 2017-04-17 DIAGNOSIS — Z79899 Other long term (current) drug therapy: Secondary | ICD-10-CM | POA: Diagnosis not present

## 2017-04-17 DIAGNOSIS — M419 Scoliosis, unspecified: Secondary | ICD-10-CM | POA: Diagnosis not present

## 2017-04-17 DIAGNOSIS — M545 Low back pain: Secondary | ICD-10-CM | POA: Diagnosis not present

## 2017-05-13 DIAGNOSIS — M545 Low back pain: Secondary | ICD-10-CM | POA: Diagnosis not present

## 2017-05-13 DIAGNOSIS — G8929 Other chronic pain: Secondary | ICD-10-CM | POA: Diagnosis not present

## 2017-05-13 DIAGNOSIS — Z79899 Other long term (current) drug therapy: Secondary | ICD-10-CM | POA: Diagnosis not present

## 2017-05-13 DIAGNOSIS — M419 Scoliosis, unspecified: Secondary | ICD-10-CM | POA: Diagnosis not present

## 2017-06-10 DIAGNOSIS — Z79899 Other long term (current) drug therapy: Secondary | ICD-10-CM | POA: Diagnosis not present

## 2017-06-10 DIAGNOSIS — M419 Scoliosis, unspecified: Secondary | ICD-10-CM | POA: Diagnosis not present

## 2017-06-11 ENCOUNTER — Encounter (HOSPITAL_COMMUNITY)
Admission: RE | Admit: 2017-06-11 | Discharge: 2017-06-11 | Disposition: A | Discharge status: Home / Self Care | Payer: Medicare Other | Source: Ambulatory Visit | Attending: Gastroenterology | Admitting: Gastroenterology

## 2017-06-11 DIAGNOSIS — R109 Unspecified abdominal pain: Secondary | ICD-10-CM | POA: Insufficient documentation

## 2017-06-11 MED ORDER — TECHNETIUM TC 99M SULFUR COLLOID
2.1900 | Freq: Once | INTRAVENOUS | Status: AC | PRN
  Administered 2017-06-11: 2.19 via ORAL

## 2017-06-11 MED ORDER — TECHNETIUM TC 99M SULFUR COLLOID: 2.1900 | Freq: Once | INTRAVENOUS | Status: DC | PRN

## 2017-06-26 DIAGNOSIS — R110 Nausea: Secondary | ICD-10-CM | POA: Diagnosis not present

## 2017-07-10 DIAGNOSIS — M419 Scoliosis, unspecified: Secondary | ICD-10-CM | POA: Diagnosis not present

## 2017-07-10 DIAGNOSIS — M810 Age-related osteoporosis without current pathological fracture: Secondary | ICD-10-CM | POA: Diagnosis not present

## 2017-07-10 DIAGNOSIS — Z79899 Other long term (current) drug therapy: Secondary | ICD-10-CM | POA: Diagnosis not present

## 2017-07-10 DIAGNOSIS — G894 Chronic pain syndrome: Secondary | ICD-10-CM | POA: Diagnosis not present

## 2017-07-14 ENCOUNTER — Other Ambulatory Visit: Payer: Self-pay | Admitting: Nurse Practitioner

## 2017-07-14 DIAGNOSIS — G894 Chronic pain syndrome: Secondary | ICD-10-CM

## 2017-07-15 ENCOUNTER — Ambulatory Visit
Admission: RE | Admit: 2017-07-15 | Discharge: 2017-07-15 | Disposition: A | Discharge status: Home / Self Care | Payer: Medicare Other | Source: Ambulatory Visit | Attending: Nurse Practitioner | Admitting: Nurse Practitioner

## 2017-07-15 DIAGNOSIS — G894 Chronic pain syndrome: Secondary | ICD-10-CM

## 2017-07-15 DIAGNOSIS — M48061 Spinal stenosis, lumbar region without neurogenic claudication: Secondary | ICD-10-CM | POA: Diagnosis not present

## 2017-07-24 DIAGNOSIS — D369 Benign neoplasm, unspecified site: Secondary | ICD-10-CM | POA: Diagnosis not present

## 2017-07-24 DIAGNOSIS — D120 Benign neoplasm of cecum: Secondary | ICD-10-CM | POA: Diagnosis not present

## 2017-07-24 DIAGNOSIS — N186 End stage renal disease: Secondary | ICD-10-CM | POA: Diagnosis not present

## 2017-07-24 DIAGNOSIS — K635 Polyp of colon: Secondary | ICD-10-CM | POA: Diagnosis not present

## 2017-08-05 DIAGNOSIS — M5136 Other intervertebral disc degeneration, lumbar region: Secondary | ICD-10-CM | POA: Diagnosis not present

## 2017-08-05 DIAGNOSIS — Z79899 Other long term (current) drug therapy: Secondary | ICD-10-CM | POA: Diagnosis not present

## 2017-08-05 DIAGNOSIS — G894 Chronic pain syndrome: Secondary | ICD-10-CM | POA: Diagnosis not present

## 2017-09-08 DIAGNOSIS — M5136 Other intervertebral disc degeneration, lumbar region: Secondary | ICD-10-CM | POA: Diagnosis not present

## 2017-09-08 DIAGNOSIS — Z79899 Other long term (current) drug therapy: Secondary | ICD-10-CM | POA: Diagnosis not present

## 2017-09-08 DIAGNOSIS — G894 Chronic pain syndrome: Secondary | ICD-10-CM | POA: Diagnosis not present

## 2017-10-06 DIAGNOSIS — G894 Chronic pain syndrome: Secondary | ICD-10-CM | POA: Diagnosis not present

## 2017-10-06 DIAGNOSIS — M5136 Other intervertebral disc degeneration, lumbar region: Secondary | ICD-10-CM | POA: Diagnosis not present

## 2017-10-06 DIAGNOSIS — Z79899 Other long term (current) drug therapy: Secondary | ICD-10-CM | POA: Diagnosis not present

## 2017-10-29 DIAGNOSIS — M5440 Lumbago with sciatica, unspecified side: Secondary | ICD-10-CM | POA: Diagnosis not present

## 2017-10-29 DIAGNOSIS — M4120 Other idiopathic scoliosis, site unspecified: Secondary | ICD-10-CM | POA: Diagnosis not present

## 2017-10-30 DIAGNOSIS — M5136 Other intervertebral disc degeneration, lumbar region: Secondary | ICD-10-CM | POA: Diagnosis not present

## 2017-10-30 DIAGNOSIS — Z79899 Other long term (current) drug therapy: Secondary | ICD-10-CM | POA: Diagnosis not present

## 2017-10-30 DIAGNOSIS — G894 Chronic pain syndrome: Secondary | ICD-10-CM | POA: Diagnosis not present

## 2017-10-31 ENCOUNTER — Other Ambulatory Visit: Payer: Self-pay | Admitting: Neurosurgery

## 2017-10-31 DIAGNOSIS — M4120 Other idiopathic scoliosis, site unspecified: Secondary | ICD-10-CM

## 2017-11-07 ENCOUNTER — Ambulatory Visit
Admission: RE | Admit: 2017-11-07 | Discharge: 2017-11-07 | Disposition: A | Discharge status: Home / Self Care | Payer: Medicare Other | Source: Ambulatory Visit | Attending: Neurosurgery | Admitting: Neurosurgery

## 2017-11-07 DIAGNOSIS — M4120 Other idiopathic scoliosis, site unspecified: Secondary | ICD-10-CM

## 2017-11-07 DIAGNOSIS — M48061 Spinal stenosis, lumbar region without neurogenic claudication: Secondary | ICD-10-CM | POA: Diagnosis not present

## 2017-11-07 DIAGNOSIS — M5124 Other intervertebral disc displacement, thoracic region: Secondary | ICD-10-CM | POA: Diagnosis not present

## 2017-11-12 DIAGNOSIS — M4120 Other idiopathic scoliosis, site unspecified: Secondary | ICD-10-CM | POA: Diagnosis not present

## 2017-12-07 DIAGNOSIS — M419 Scoliosis, unspecified: Secondary | ICD-10-CM | POA: Diagnosis not present

## 2017-12-07 DIAGNOSIS — Z79899 Other long term (current) drug therapy: Secondary | ICD-10-CM | POA: Diagnosis not present

## 2017-12-07 DIAGNOSIS — G894 Chronic pain syndrome: Secondary | ICD-10-CM | POA: Diagnosis not present

## 2017-12-07 DIAGNOSIS — M5136 Other intervertebral disc degeneration, lumbar region: Secondary | ICD-10-CM | POA: Diagnosis not present

## 2017-12-15 ENCOUNTER — Other Ambulatory Visit: Payer: Self-pay | Admitting: Gastroenterology

## 2017-12-15 DIAGNOSIS — K861 Other chronic pancreatitis: Secondary | ICD-10-CM

## 2017-12-27 ENCOUNTER — Ambulatory Visit
Admission: RE | Admit: 2017-12-27 | Discharge: 2017-12-27 | Disposition: A | Discharge status: Home / Self Care | Payer: Medicare Other | Source: Ambulatory Visit | Attending: Gastroenterology | Admitting: Gastroenterology

## 2017-12-27 DIAGNOSIS — K861 Other chronic pancreatitis: Secondary | ICD-10-CM | POA: Diagnosis not present

## 2017-12-27 MED ORDER — GADOBENATE DIMEGLUMINE 529 MG/ML IV SOLN
10.0000 mL | Freq: Once | INTRAVENOUS | Status: AC | PRN
  Administered 2017-12-27: 10 mL via INTRAVENOUS

## 2018-01-05 DIAGNOSIS — K861 Other chronic pancreatitis: Secondary | ICD-10-CM | POA: Diagnosis not present

## 2018-01-05 DIAGNOSIS — K838 Other specified diseases of biliary tract: Secondary | ICD-10-CM | POA: Diagnosis not present

## 2018-01-05 DIAGNOSIS — N186 End stage renal disease: Secondary | ICD-10-CM | POA: Diagnosis not present

## 2018-01-05 DIAGNOSIS — K254 Chronic or unspecified gastric ulcer with hemorrhage: Secondary | ICD-10-CM | POA: Diagnosis not present

## 2018-01-05 DIAGNOSIS — K3189 Other diseases of stomach and duodenum: Secondary | ICD-10-CM | POA: Diagnosis not present

## 2018-01-05 DIAGNOSIS — K8689 Other specified diseases of pancreas: Secondary | ICD-10-CM | POA: Diagnosis not present

## 2018-01-05 DIAGNOSIS — K319 Disease of stomach and duodenum, unspecified: Secondary | ICD-10-CM | POA: Diagnosis not present

## 2018-01-05 DIAGNOSIS — Z880 Allergy status to penicillin: Secondary | ICD-10-CM | POA: Diagnosis not present

## 2018-06-23 IMAGING — DX DG CHEST 2V
2 series · 2 of 2 positions shown · non-contrast
Comparison: 08/16/2015 spine radiographs

CLINICAL DATA: Cough, history of smoking, chills

EXAM:
CHEST  2 VIEW

[chest pa]
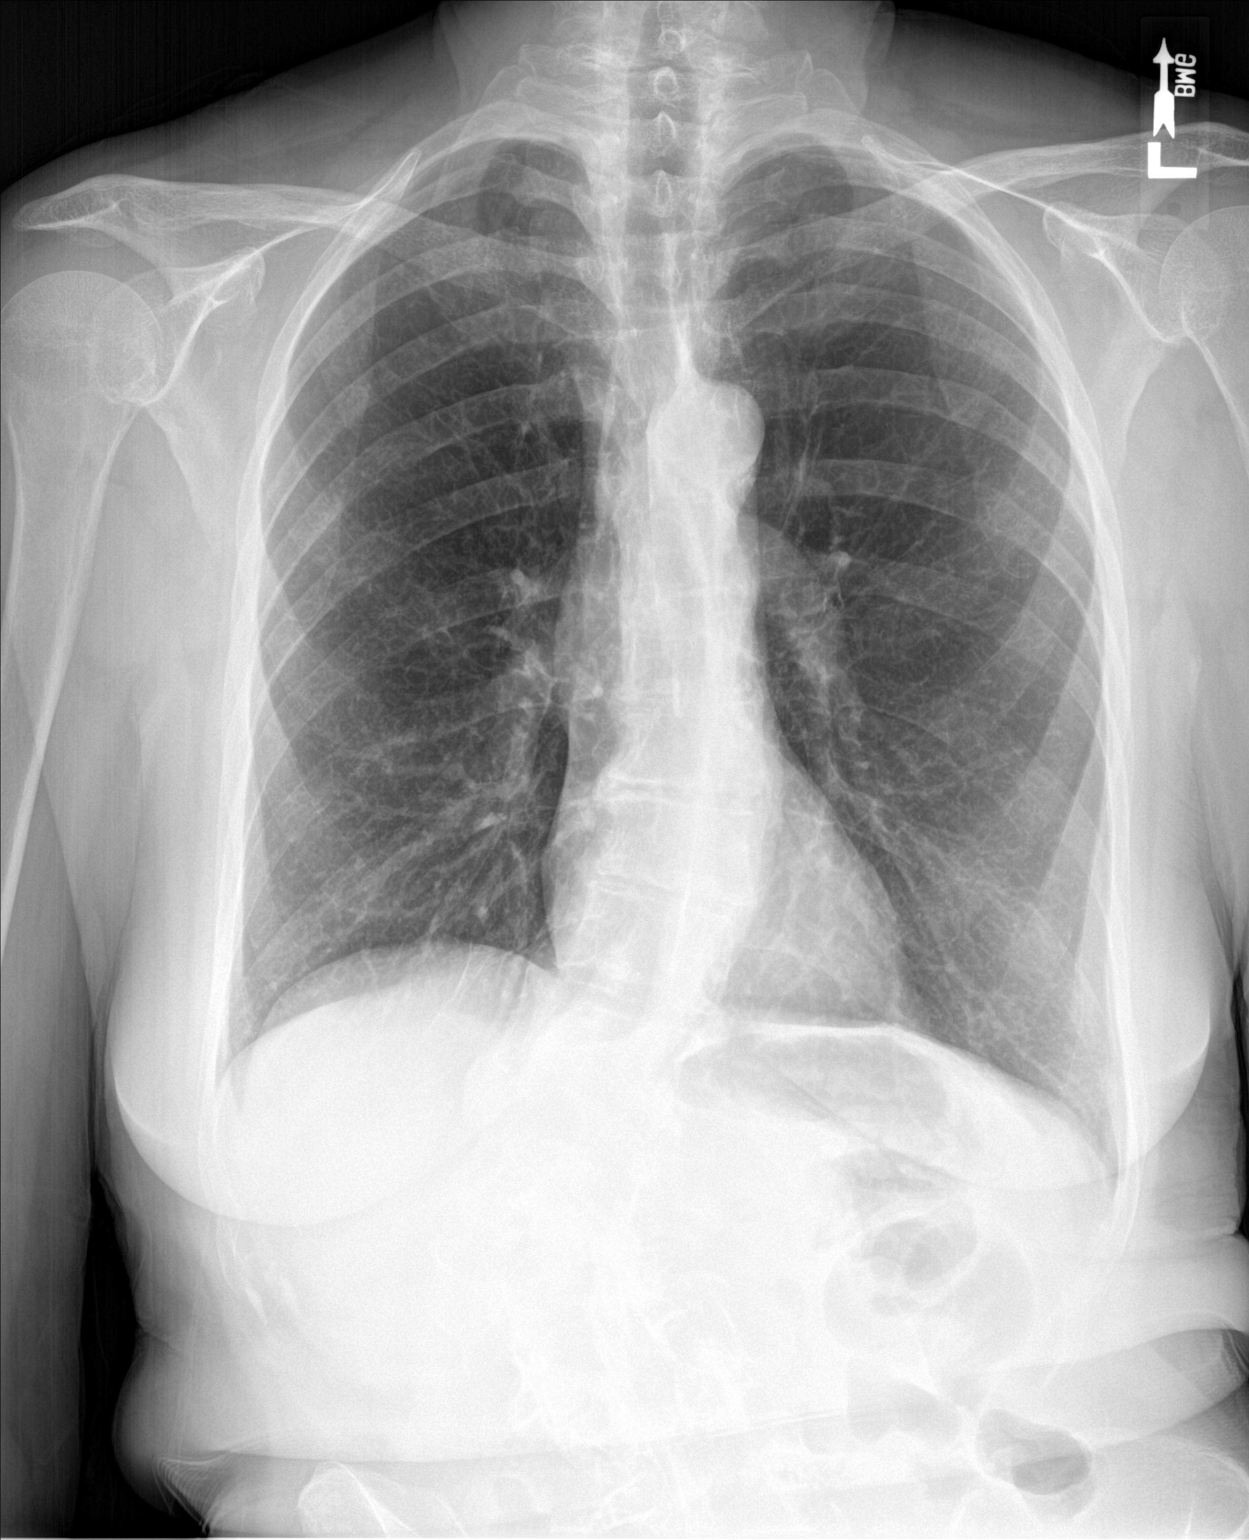

[chest lat]
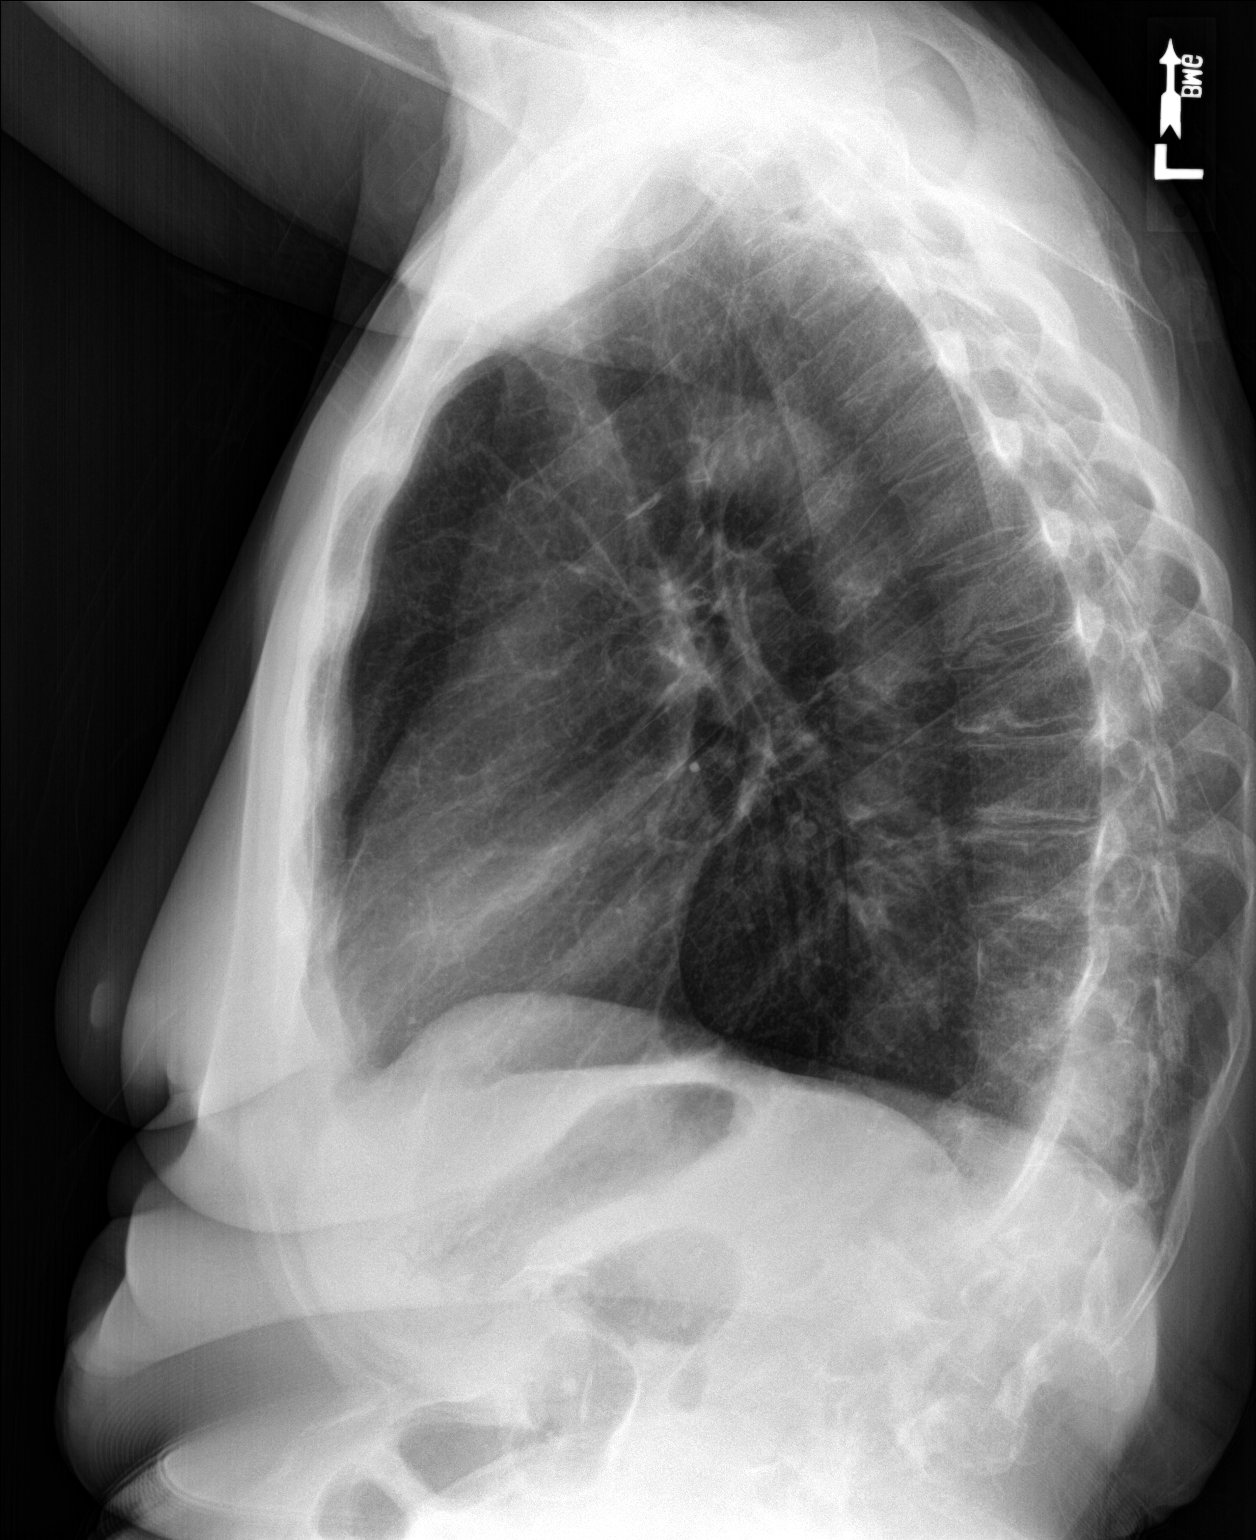

[2 of 2 positions shown; findings below may reference images not displayed]

FINDINGS: Hyperinflation of the upper lobes with attenuated pulmonary vessels.
There is resultant slight crowding of lower lobe interstitial lung
markings. There is atelectasis and/or scarring at the right lung
base medially. No pneumonic consolidation, CHF nor effusion. The
heart is normal in size. The aorta is not aneurysmal. Mild
prominence of the pulmonary arteries suggesting pulmonary
hypertension. Dextroconvex curvature of the upper lumbar spine.
IMPRESSION: 1. No active cardiopulmonary disease.
2. COPD.
3. Mild prominence of the pulmonary arteries suggest pulmonary
hypertension.
4. Dextroscoliosis of the upper lumbar spine.

## 2018-11-02 IMAGING — CR DG CHEST 2V
2 series · 2 of 2 positions shown · non-contrast
Comparison: Radiographs October 02, 2015.

CLINICAL DATA: Medical clearance.

EXAM:
CHEST  2 VIEW

[w chest pa]
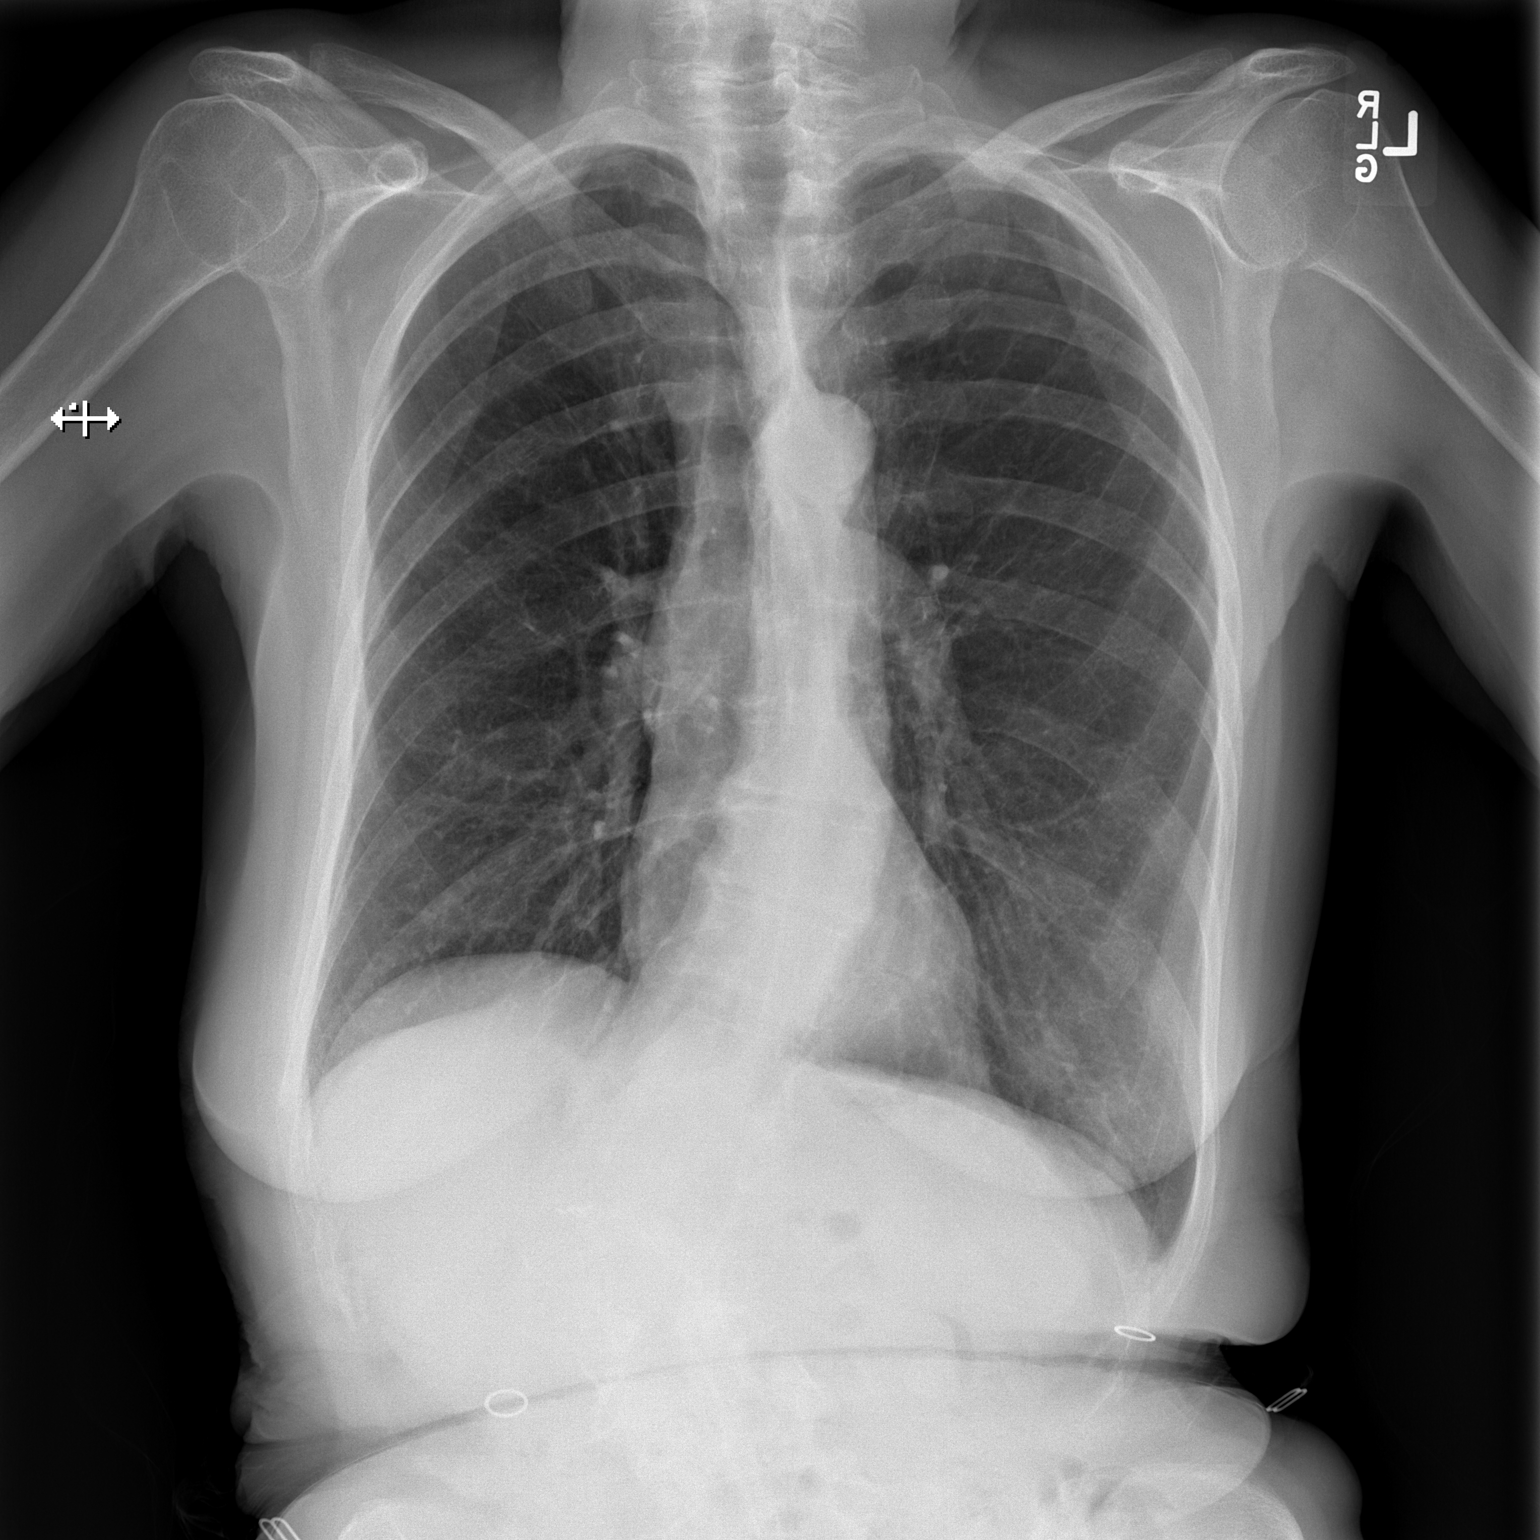

[w chest lat]
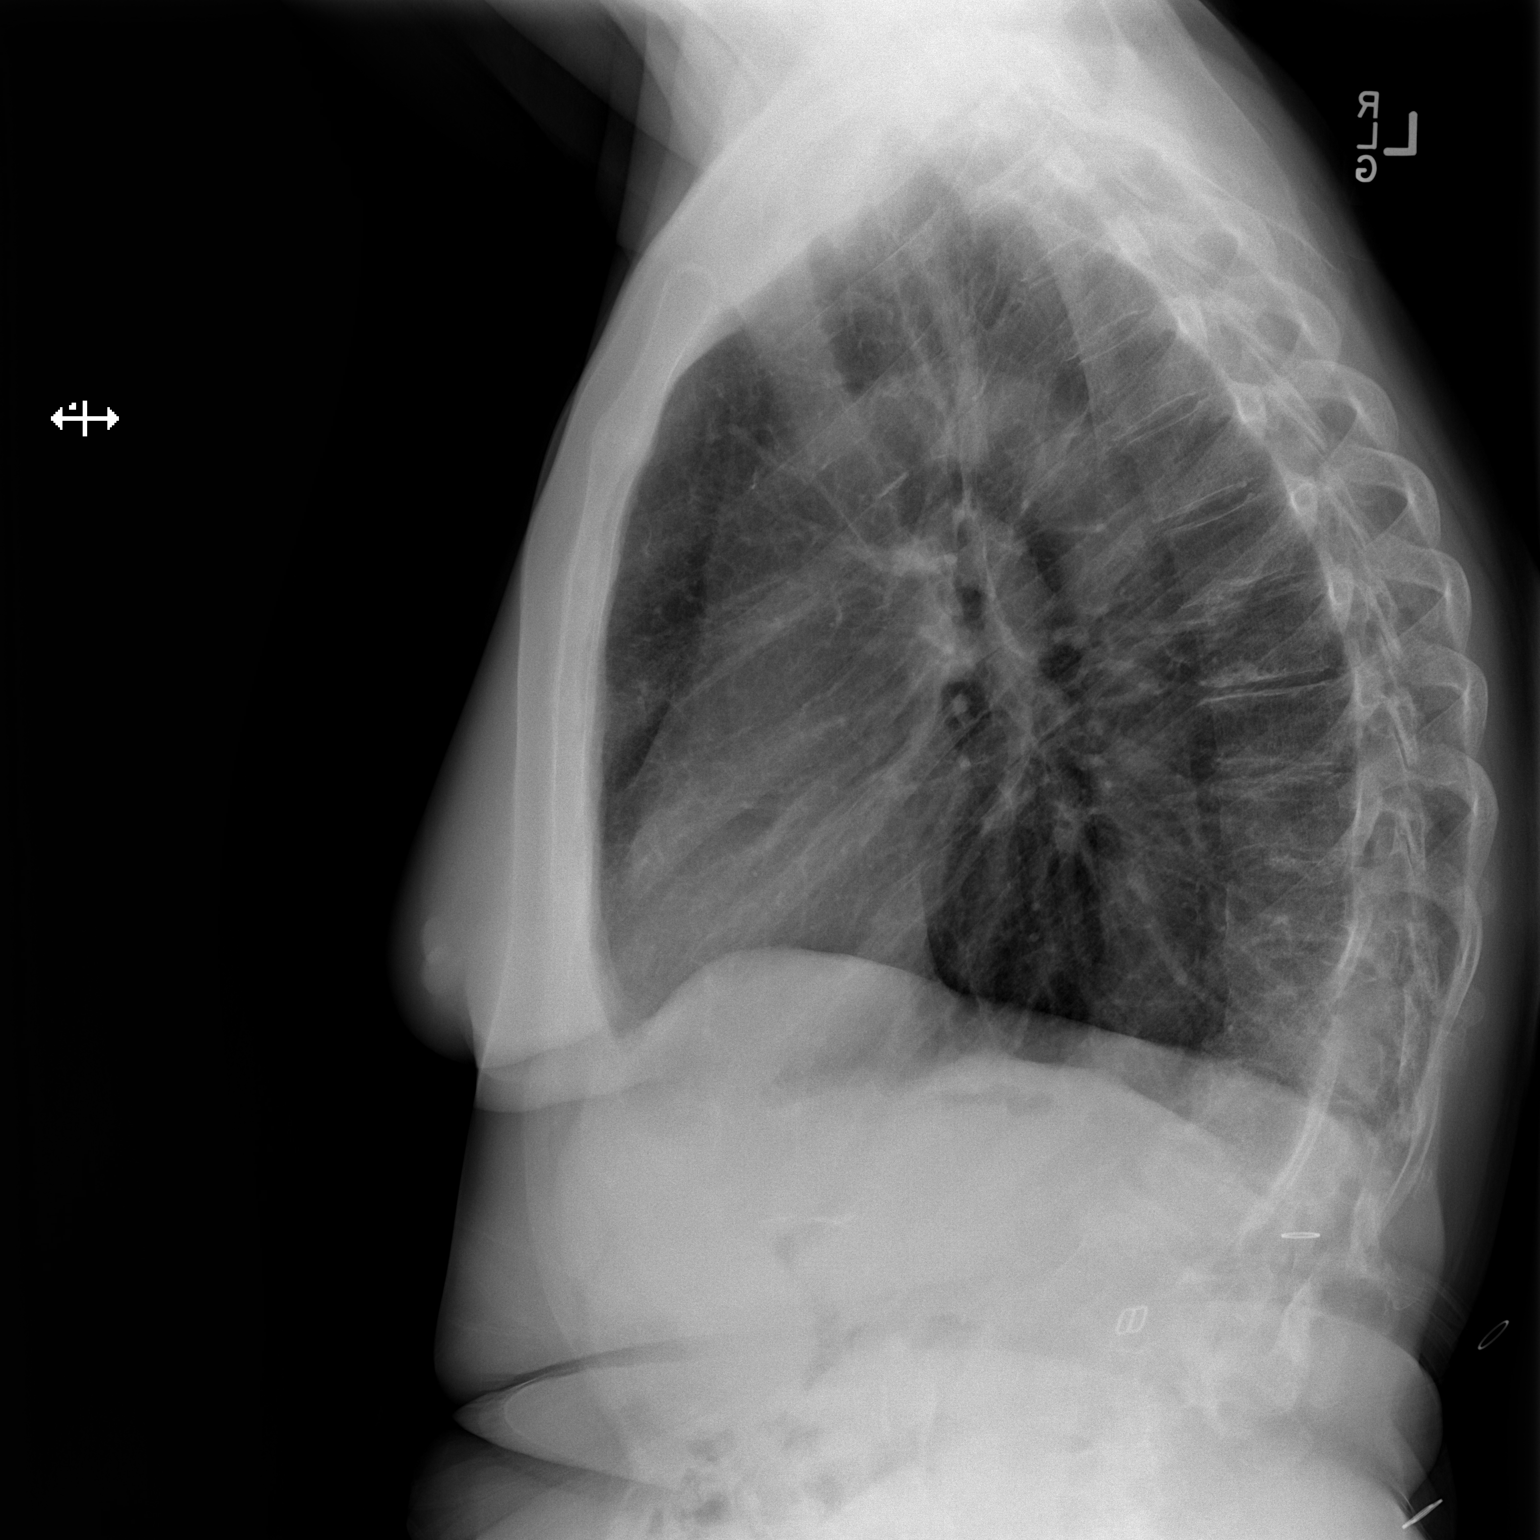

[2 of 2 positions shown; findings below may reference images not displayed]

FINDINGS: The heart size and mediastinal contours are within normal limits.
Both lungs are clear. The visualized skeletal structures are
unremarkable.
IMPRESSION: No active cardiopulmonary disease.

## 2019-03-19 ENCOUNTER — Ambulatory Visit: Payer: Medicare Other | Attending: Internal Medicine

## 2019-03-19 DIAGNOSIS — Z23 Encounter for immunization: Secondary | ICD-10-CM

## 2019-03-19 NOTE — Progress Notes (Signed)
   Covid-19 Vaccination Clinic  Name:  Stephanie Frank    MRN: [id number] DOB: 01-03-1946  03/19/2019  Stephanie Frank was observed post Covid-19 immunization for 15 minutes without incident. She was provided with Vaccine Information Sheet and instruction to access the V-Safe system.   Stephanie Frank was instructed to call 911 with any severe reactions post vaccine: Marland Kitchen Difficulty breathing  . Swelling of face and throat  . A fast heartbeat  . A bad rash all over body  . Dizziness and weakness   Immunizations Administered    Name Date Dose VIS Date Route   Pfizer COVID-19 Vaccine 03/19/2019  2:36 PM 0.3 mL 12/17/2018 Intramuscular   Manufacturer: [place]   Lot: [id number]   [place]: [id number]

## 2019-03-30 ENCOUNTER — Other Ambulatory Visit: Payer: Self-pay | Admitting: Nurse Practitioner

## 2019-03-30 DIAGNOSIS — W19XXXA Unspecified fall, initial encounter: Secondary | ICD-10-CM

## 2019-04-12 ENCOUNTER — Ambulatory Visit: Payer: Medicare Other | Attending: Internal Medicine

## 2019-04-12 DIAGNOSIS — Z23 Encounter for immunization: Secondary | ICD-10-CM

## 2019-04-12 NOTE — Progress Notes (Signed)
   Covid-19 Vaccination Clinic  Name:  Stephanie Frank    MRN: [id number] DOB: 1945-10-07  04/12/2019  Stephanie Frank was observed post Covid-19 immunization for 15 minutes without incident. She was provided with Vaccine Information Sheet and instruction to access the V-Safe system.   Stephanie Frank was instructed to call 911 with any severe reactions post vaccine: Marland Kitchen Difficulty breathing  . Swelling of face and throat  . A fast heartbeat  . A bad rash all over body  . Dizziness and weakness   Immunizations Administered    Name Date Dose VIS Date Route   Pfizer COVID-19 Vaccine 04/12/2019  3:42 PM 0.3 mL 12/17/2018 Intramuscular   Manufacturer: [employer]   Lot: [id number]   [place]: [id number]

## 2019-05-06 ENCOUNTER — Other Ambulatory Visit: Payer: Medicare Other

## 2019-05-31 ENCOUNTER — Other Ambulatory Visit: Payer: Medicare Other

## 2019-06-12 ENCOUNTER — Other Ambulatory Visit: Payer: Medicare Other

## 2019-06-12 ENCOUNTER — Ambulatory Visit
Admission: RE | Admit: 2019-06-12 | Discharge: 2019-06-12 | Disposition: A | Discharge status: Home / Self Care | Payer: Medicare Other | Source: Ambulatory Visit | Attending: Nurse Practitioner | Admitting: Nurse Practitioner

## 2019-06-12 DIAGNOSIS — W19XXXA Unspecified fall, initial encounter: Secondary | ICD-10-CM

## 2019-06-12 MED ORDER — GADOBENATE DIMEGLUMINE 529 MG/ML IV SOLN
10.0000 mL | Freq: Once | INTRAVENOUS | Status: AC | PRN
  Administered 2019-06-12: 10 mL via INTRAVENOUS

## 2019-07-02 ENCOUNTER — Other Ambulatory Visit: Payer: Medicare Other
